# Patient Record
Sex: Male | Born: 1945 | Race: White | Hispanic: No | Marital: Married | Smoking: Current every day smoker
Health system: Southern US, Community
[De-identification: ages and names within clinical notes are randomized; demographics above are authoritative.]

## PROBLEM LIST (undated history)

## (undated) DIAGNOSIS — I10 Essential (primary) hypertension: Secondary | ICD-10-CM

## (undated) DIAGNOSIS — K862 Cyst of pancreas: Secondary | ICD-10-CM

## (undated) DIAGNOSIS — M1990 Unspecified osteoarthritis, unspecified site: Secondary | ICD-10-CM

## (undated) DIAGNOSIS — G8929 Other chronic pain: Secondary | ICD-10-CM

## (undated) HISTORY — PX: TONSILLECTOMY: SUR1361

## (undated) HISTORY — DX: Other chronic pain: G89.29

## (undated) HISTORY — PX: CHOLECYSTECTOMY: SHX55

## (undated) HISTORY — DX: Unspecified osteoarthritis, unspecified site: M19.90

## (undated) HISTORY — DX: Cyst of pancreas: K86.2

---

## 2001-11-14 ENCOUNTER — Encounter: Payer: Self-pay | Admitting: General Surgery

## 2001-11-14 ENCOUNTER — Encounter: Payer: Self-pay | Admitting: Emergency Medicine

## 2001-11-14 ENCOUNTER — Inpatient Hospital Stay (HOSPITAL_COMMUNITY): Admission: EM | Admit: 2001-11-14 | Discharge: 2001-11-16 | Payer: Self-pay | Admitting: Emergency Medicine

## 2015-11-22 ENCOUNTER — Encounter (HOSPITAL_COMMUNITY): Payer: Self-pay | Admitting: Family Medicine

## 2015-11-22 ENCOUNTER — Emergency Department (HOSPITAL_COMMUNITY)
Admission: EM | Admit: 2015-11-22 | Discharge: 2015-11-22 | Disposition: A | Discharge status: Home / Self Care | Payer: Medicare Other | Attending: Emergency Medicine | Admitting: Emergency Medicine

## 2015-11-22 DIAGNOSIS — F17200 Nicotine dependence, unspecified, uncomplicated: Secondary | ICD-10-CM | POA: Diagnosis not present

## 2015-11-22 DIAGNOSIS — Z880 Allergy status to penicillin: Secondary | ICD-10-CM | POA: Insufficient documentation

## 2015-11-22 DIAGNOSIS — R42 Dizziness and giddiness: Secondary | ICD-10-CM | POA: Diagnosis not present

## 2015-11-22 DIAGNOSIS — I10 Essential (primary) hypertension: Secondary | ICD-10-CM | POA: Insufficient documentation

## 2015-11-22 DIAGNOSIS — L02212 Cutaneous abscess of back [any part, except buttock]: Secondary | ICD-10-CM | POA: Diagnosis present

## 2015-11-22 HISTORY — DX: Essential (primary) hypertension: I10

## 2015-11-22 LAB — COMPREHENSIVE METABOLIC PANEL
ALT: 26 U/L (ref 17–63)
AST: 43 U/L — ABNORMAL HIGH (ref 15–41)
Albumin: 3.9 g/dL (ref 3.5–5.0)
Alkaline Phosphatase: 103 U/L (ref 38–126)
Anion gap: 13 (ref 5–15)
BUN: 6 mg/dL (ref 6–20)
CO2: 24 mmol/L (ref 22–32)
Calcium: 9.4 mg/dL (ref 8.9–10.3)
Chloride: 106 mmol/L (ref 101–111)
Creatinine, Ser: 0.92 mg/dL (ref 0.61–1.24)
GFR calc Af Amer: >60 mL/min (ref >60)
GFR calc non Af Amer: >60 mL/min (ref >60)
Glucose, Bld: 106 mg/dL — ABNORMAL HIGH (ref 65–99)
Potassium: 3.7 mmol/L (ref 3.5–5.1)
Sodium: 143 mmol/L (ref 135–145)
Total Bilirubin: 0.3 mg/dL (ref 0.3–1.2)
Total Protein: 7.3 g/dL (ref 6.5–8.1)

## 2015-11-22 LAB — CBC WITH DIFFERENTIAL/PLATELET
Basophils Absolute: 0.1 10*3/uL (ref 0.0–0.1)
Basophils Relative: 1 %
Eosinophils Absolute: 0.1 10*3/uL (ref 0.0–0.7)
Eosinophils Relative: 3 %
HCT: 46.4 % (ref 39.0–52.0)
Hemoglobin: 16.6 g/dL (ref 13.0–17.0)
Lymphocytes Relative: 33 %
Lymphs Abs: 1.5 10*3/uL (ref 0.7–4.0)
MCH: 34.2 pg — ABNORMAL HIGH (ref 26.0–34.0)
MCHC: 35.8 g/dL (ref 30.0–36.0)
MCV: 95.5 fL (ref 78.0–100.0)
Monocytes Absolute: 0.7 10*3/uL (ref 0.1–1.0)
Monocytes Relative: 15 %
Neutro Abs: 2.3 10*3/uL (ref 1.7–7.7)
Neutrophils Relative %: 48 %
Platelets: 183 10*3/uL (ref 150–400)
RBC: 4.86 MIL/uL (ref 4.22–5.81)
RDW: 12.4 % (ref 11.5–15.5)
WBC: 4.7 10*3/uL (ref 4.0–10.5)

## 2015-11-22 LAB — I-STAT CG4 LACTIC ACID, ED: Lactic Acid, Venous: 1.63 mmol/L (ref 0.5–2.0)

## 2015-11-22 MED ORDER — LIDOCAINE-EPINEPHRINE (PF) 2 %-1:200000 IJ SOLN: 10.0000 mL | Freq: Once | INTRAMUSCULAR | Status: DC

## 2015-11-22 MED ORDER — SODIUM BICARBONATE 4 % IV SOLN
5.0000 mL | Freq: Once | INTRAVENOUS | Status: AC
  Administered 2015-11-22: 5 mL via INTRAVENOUS
  Filled 2015-11-22: qty 5

## 2015-11-22 MED ORDER — HYDROCODONE-ACETAMINOPHEN 5-325 MG PO TABS: 1.0000 | ORAL_TABLET | Freq: Four times a day (QID) | ORAL | Status: DC | PRN

## 2015-11-22 MED ORDER — CLINDAMYCIN HCL 150 MG PO CAPS: 300.0000 mg | ORAL_CAPSULE | Freq: Three times a day (TID) | ORAL | Status: DC

## 2015-11-22 MED ORDER — IBUPROFEN 600 MG PO TABS: 600.0000 mg | ORAL_TABLET | Freq: Four times a day (QID) | ORAL | Status: DC | PRN

## 2015-11-22 MED ORDER — OXYCODONE-ACETAMINOPHEN 5-325 MG PO TABS
1.0000 | ORAL_TABLET | Freq: Once | ORAL | Status: AC
  Administered 2015-11-22: 1 via ORAL
  Filled 2015-11-22: qty 1

## 2015-11-22 MED ORDER — LIDOCAINE HCL 2 % IJ SOLN
20.0000 mL | Freq: Once | INTRAMUSCULAR | Status: AC
  Administered 2015-11-22: 400 mg
  Filled 2015-11-22: qty 20

## 2015-11-22 NOTE — ED Provider Notes (Signed)
CSN: [id number]     Arrival date & time 11/22/15  1551 History   First MD Initiated Contact with Patient 11/22/15 1758     Chief Complaint  Patient presents with  . Abscess     (Consider location/radiation/quality/duration/timing/severity/associated sxs/prior Treatment) HPI Dustin Park is a 70 y.o. male with hx of htn, presents to ED with complaint of an abscess to the back. Pt states it began about 3 wks ago. It ha been draining purulent drainage and has been progressively getting larger. Pt states he tried to cut it with a knife but was unable to. He also tried warm compresses without relief. He states it is very painful and he feels like he cannot focus. He denies any fever or chills. There is no redness or streaks around the abscess. He has not taken any medications for this. He reports similar abscess several years ago that had to be I&Ded and states he was told that it may come back.   Past Medical History  Diagnosis Date  . Hypertension    Past Surgical History  Procedure Laterality Date  . Cholecystectomy    . Tonsillectomy     History reviewed. No pertinent family history. Social History  Substance Use Topics  . Smoking status: Current Every Day Smoker -- 1.00 packs/day  . Smokeless tobacco: None  . Alcohol Use: Yes     Comment: 1/2 pint a day    Review of Systems  Constitutional: Negative for fever and chills.  Respiratory: Negative for cough, chest tightness and shortness of breath.   Cardiovascular: Negative for chest pain, palpitations and leg swelling.  Gastrointestinal: Negative for nausea, vomiting, abdominal pain and diarrhea.  Musculoskeletal: Negative for myalgias, arthralgias, neck pain and neck stiffness.  Skin: Positive for wound. Negative for rash.  Allergic/Immunologic: Negative for immunocompromised state.  Neurological: Positive for dizziness and light-headedness. Negative for weakness, numbness and headaches.  All other systems reviewed and are  negative.     Allergies  Penicillins  Home Medications   Prior to Admission medications   Not on File   BP 170/88 mmHg  Pulse 75  Temp(Src) 98.4 F (36.9 C) (Oral)  Resp 18  SpO2 98% Physical Exam  Constitutional: He appears well-developed and well-nourished. No distress.  HENT:  Head: Normocephalic and atraumatic.  Eyes: Conjunctivae are normal.  Neck: Neck supple.  Cardiovascular: Normal rate, regular rhythm and normal heart sounds.   Pulmonary/Chest: Effort normal. No respiratory distress. He has no wheezes. He has no rales.  Abdominal: Soft. Bowel sounds are normal. He exhibits no distension. There is no tenderness. There is no rebound.  Musculoskeletal: He exhibits no edema.  Neurological: He is alert.  Skin: Skin is warm and dry.  5x5 cm abscess to the left lower back. Abscess is erythematous, fluctuant, with small purulent drainage. Ttp. No surrounding cellulitic changes  Nursing note and vitals reviewed.   ED Course  Procedures (including critical care time) Labs Review Labs Reviewed  CBC WITH DIFFERENTIAL/PLATELET - Abnormal; Notable for the following:    MCH 34.2 (*)    All other components within normal limits  COMPREHENSIVE METABOLIC PANEL - Abnormal; Notable for the following:    Glucose, Bld 106 (*)    AST 43 (*)    All other components within normal limits  WOUND CULTURE  I-STAT CG4 LACTIC ACID, ED    Imaging Review No results found. I have personally reviewed and evaluated these images and lab results as part of my medical decision-making.  EKG Interpretation None      INCISION AND DRAINAGE Performed by: Jaynie Crumble A Consent: Verbal consent obtained. Risks and benefits: risks, benefits and alternatives were discussed Type: abscess  Body area: left lower back  Anesthesia: local infiltration  Incision was made with a scalpel.  Local anesthetic: lidocaine 2% wo epinephrine with bicarb  Anesthetic total: 8  ml  Complexity: complex Blunt dissection to break up loculations  Drainage: purulent with sebaceous material  Drainage amount: large  Packing material: 1/4 in iodoform gauze  Patient tolerance: Patient tolerated the procedure well with no immediate complications.    MDM   Final diagnoses:  Abscess of back    patient with lower back skin abscess that appears to be infected. Incised and drained a large amount of purulent and sebaceous material. Packed. We'll continue warm compresses at home. Antibiotics, clindamycin. Ibuprofen and Norco for pain at home. Patient does not appear to be septic or has no evidence of systemic infection at this time. His blood work is unremarkable. His temperature in emergency department is normal. Discussed return precautions and at home wound care. Will have patient follow-up in 2 days.  Filed Vitals:   11/22/15 1815 11/22/15 1830 11/22/15 1845 11/22/15 1900  BP: 160/93 145/86 138/84 141/82  Pulse: 71 63 69 68  Temp:      TempSrc:      Resp:      SpO2: 100% 99% 99% 98%     I personally performed the services described in this documentation, which was scribed in my presence. The recorded information has been reviewed and is accurate.    Jaynie Crumble, PA-C 11/23/15 1029  Linwood Dibbles, MD 11/23/15 [phone]

## 2015-11-22 NOTE — Discharge Instructions (Signed)
Ibuprofen for pain. norco for severe pain. Clindamycin until all gone for infection. Return in 2 days for recheck or if looks better, remove packing yourself. Continue warm compresses to the ara. Change dressing as needed.    Abscess An abscess is an infected area that contains a collection of pus and debris.It can occur in almost any part of the body. An abscess is also known as a furuncle or boil. CAUSES  An abscess occurs when tissue gets infected. This can occur from blockage of oil or sweat glands, infection of hair follicles, or a minor injury to the skin. As the body tries to fight the infection, pus collects in the area and creates pressure under the skin. This pressure causes pain. People with weakened immune systems have difficulty fighting infections and get certain abscesses more often.  SYMPTOMS Usually an abscess develops on the skin and becomes a painful mass that is red, warm, and tender. If the abscess forms under the skin, you may feel a moveable soft area under the skin. Some abscesses break open (rupture) on their own, but most will continue to get worse without care. The infection can spread deeper into the body and eventually into the bloodstream, causing you to feel ill.  DIAGNOSIS  Your caregiver will take your medical history and perform a physical exam. A sample of fluid may also be taken from the abscess to determine what is causing your infection. TREATMENT  Your caregiver may prescribe antibiotic medicines to fight the infection. However, taking antibiotics alone usually does not cure an abscess. Your caregiver may need to make a small cut (incision) in the abscess to drain the pus. In some cases, gauze is packed into the abscess to reduce pain and to continue draining the area. HOME CARE INSTRUCTIONS   Only take over-the-counter or prescription medicines for pain, discomfort, or fever as directed by your caregiver.  If you were prescribed antibiotics, take them as  directed. Finish them even if you start to feel better.  If gauze is used, follow your caregiver's directions for changing the gauze.  To avoid spreading the infection:  Keep your draining abscess covered with a bandage.  Wash your hands well.  Do not share personal care items, towels, or whirlpools with others.  Avoid skin contact with others.  Keep your skin and clothes clean around the abscess.  Keep all follow-up appointments as directed by your caregiver. SEEK MEDICAL CARE IF:   You have increased pain, swelling, redness, fluid drainage, or bleeding.  You have muscle aches, chills, or a general ill feeling.  You have a fever. MAKE SURE YOU:   Understand these instructions.  Will watch your condition.  Will get help right away if you are not doing well or get worse.   This information is not intended to replace advice given to you by your health care provider. Make sure you discuss any questions you have with your health care provider.   Document Released: 07/01/2005 Document Revised: 03/22/2012 Document Reviewed: 12/04/2011 Elsevier Interactive Patient Education [employer]

## 2015-11-22 NOTE — ED Notes (Signed)
Pt A&OX4, ambulatory at d/c with steady gait but wheeled out of ED via wheelchair, NAD 

## 2015-11-22 NOTE — ED Notes (Signed)
Pt here for abscess to mid spine. sts has been there for months and he tried to drain himself, abscess very large.

## 2015-11-25 LAB — WOUND CULTURE
CULTURE: NO GROWTH
GRAM STAIN: NONE SEEN

## 2016-02-03 ENCOUNTER — Other Ambulatory Visit: Payer: Self-pay | Admitting: Dermatology

## 2016-03-12 ENCOUNTER — Other Ambulatory Visit: Payer: Self-pay | Admitting: Dermatology

## 2016-04-21 DIAGNOSIS — F17200 Nicotine dependence, unspecified, uncomplicated: Secondary | ICD-10-CM | POA: Insufficient documentation

## 2016-04-21 DIAGNOSIS — F411 Generalized anxiety disorder: Secondary | ICD-10-CM | POA: Insufficient documentation

## 2018-04-22 DIAGNOSIS — E781 Pure hyperglyceridemia: Secondary | ICD-10-CM | POA: Insufficient documentation

## 2019-10-16 DIAGNOSIS — Z23 Encounter for immunization: Secondary | ICD-10-CM | POA: Insufficient documentation

## 2019-10-16 DIAGNOSIS — L649 Androgenic alopecia, unspecified: Secondary | ICD-10-CM | POA: Insufficient documentation

## 2019-10-16 DIAGNOSIS — M62838 Other muscle spasm: Secondary | ICD-10-CM | POA: Insufficient documentation

## 2019-10-16 DIAGNOSIS — Z0000 Encounter for general adult medical examination without abnormal findings: Secondary | ICD-10-CM | POA: Insufficient documentation
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
## 2020-09-05 ENCOUNTER — Encounter: Payer: Self-pay | Admitting: Family Medicine

## 2020-09-05 ENCOUNTER — Ambulatory Visit (INDEPENDENT_AMBULATORY_CARE_PROVIDER_SITE_OTHER): Payer: Medicare Other | Admitting: Family Medicine

## 2020-09-05 ENCOUNTER — Ambulatory Visit (INDEPENDENT_AMBULATORY_CARE_PROVIDER_SITE_OTHER): Payer: Medicare Other

## 2020-09-05 ENCOUNTER — Other Ambulatory Visit: Payer: Self-pay

## 2020-09-05 VITALS — BP 140/80 | HR 63 | Ht 71.0 in | Wt 153.4 lb

## 2020-09-05 DIAGNOSIS — M545 Low back pain, unspecified: Secondary | ICD-10-CM

## 2020-09-05 DIAGNOSIS — R29898 Other symptoms and signs involving the musculoskeletal system: Secondary | ICD-10-CM

## 2020-09-05 DIAGNOSIS — S32040A Wedge compression fracture of fourth lumbar vertebra, initial encounter for closed fracture: Secondary | ICD-10-CM | POA: Diagnosis not present

## 2020-09-05 DIAGNOSIS — I10 Essential (primary) hypertension: Secondary | ICD-10-CM | POA: Insufficient documentation

## 2020-09-05 DIAGNOSIS — J309 Allergic rhinitis, unspecified: Secondary | ICD-10-CM | POA: Insufficient documentation

## 2020-09-05 MED ORDER — GABAPENTIN 300 MG PO CAPS: 300.0000 mg | ORAL_CAPSULE | Freq: Three times a day (TID) | ORAL | 3 refills | Status: DC | PRN

## 2020-09-05 MED ORDER — PREDNISONE 10 MG PO TABS: 30.0000 mg | ORAL_TABLET | Freq: Every day | ORAL | 0 refills | Status: DC

## 2020-09-05 NOTE — Patient Instructions (Addendum)
Thank you for coming in today.  If you are looking for a stomach doctor Vito Cirigliano, DO.   Please get an Xray today before you leave  You should hear from MRI scheduling within 1 week. If you do not hear please let me know.   Recheck with me following MRI.   We may change the plan if I get suprized by the xray.     Radicular Pain Radicular pain is a type of pain that spreads from your back or neck along a spinal nerve. Spinal nerves are nerves that leave the spinal cord and go to the muscles. Radicular pain is sometimes called radiculopathy, radiculitis, or a pinched nerve. When you have this type of pain, you may also have weakness, numbness, or tingling in the area of your body that is supplied by the nerve. The pain may feel sharp and burning. Depending on which spinal nerve is affected, the pain may occur in the:  Neck area (cervical radicular pain). You may also feel pain, numbness, weakness, or tingling in the arms.  Mid-spine area (thoracic radicular pain). You would feel this pain in the back and chest. This type is rare.  Lower back area (lumbar radicular pain). You would feel this pain as low back pain. You may feel pain, numbness, weakness, or tingling in the buttocks or legs. Sciatica is a type of lumbar radicular pain that shoots down the back of the leg. Radicular pain occurs when one of the spinal nerves becomes irritated or squeezed (compressed). It is often caused by something pushing on a spinal nerve, such as one of the bones of the spine (vertebrae) or one of the round cushions between vertebrae (intervertebral disks). This can result from:  An injury.  Wear and tear or aging of a disk.  The growth of a bone spur that pushes on the nerve. Radicular pain often goes away when you follow instructions from your health care provider for relieving pain at home. Follow these instructions at home: Managing pain      If directed, put ice on the affected area: ? Put  ice in a plastic bag. ? Place a towel between your skin and the bag. ? Leave the ice on for 20 minutes, 2-3 times a day.  If directed, apply heat to the affected area as often as told by your health care provider. Use the heat source that your health care provider recommends, such as a moist heat pack or a heating pad. ? Place a towel between your skin and the heat source. ? Leave the heat on for 20-30 minutes. ? Remove the heat if your skin turns bright red. This is especially important if you are unable to feel pain, heat, or cold. You may have a greater risk of getting burned. Activity   Do not sit or rest in bed for long periods of time.  Try to stay as active as possible. Ask your health care provider what type of exercise or activity is best for you.  Avoid activities that make your pain worse, such as bending and lifting.  Do not lift anything that is heavier than 10 lb (4.5 kg), or the limit that you are told, until your health care provider says that it is safe.  Practice using proper technique when lifting items. Proper lifting technique involves bending your knees and rising up.  Do strength and range-of-motion exercises only as told by your health care provider or physical therapist. General instructions  Take over-the-counter  and prescription medicines only as told by your health care provider.  Pay attention to any changes in your symptoms.  Keep all follow-up visits as told by your health care provider. This is important. ? Your health care provider may send you to a physical therapist to help with this pain. Contact a health care provider if:  Your pain and other symptoms get worse.  Your pain medicine is not helping.  Your pain has not improved after a few weeks of home care.  You have a fever. Get help right away if:  You have severe pain, weakness, or numbness.  You have difficulty with bladder or bowel control. Summary  Radicular pain is a type of  pain that spreads from your back or neck along a spinal nerve.  When you have radicular pain, you may also have weakness, numbness, or tingling in the area of your body that is supplied by the nerve.  The pain may feel sharp or burning.  Radicular pain may be treated with ice, heat, medicines, or physical therapy. This information is not intended to replace advice given to you by your health care provider. Make sure you discuss any questions you have with your health care provider. Document Revised: 04/05/2018 Document Reviewed: 04/05/2018 Elsevier Patient Education  2020 [employer].

## 2020-09-09 NOTE — Progress Notes (Signed)
There is an L4 compression fracture that is hard to tell how old it is.  I already ordered an MRI which should answer questions about compression fracture as well as possible pinched nerves.  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

## 2020-09-10 ENCOUNTER — Other Ambulatory Visit: Payer: Self-pay | Admitting: Family Medicine

## 2020-09-10 DIAGNOSIS — Z0189 Encounter for other specified special examinations: Secondary | ICD-10-CM

## 2020-09-10 DIAGNOSIS — Z1389 Encounter for screening for other disorder: Secondary | ICD-10-CM

## 2020-09-14 ENCOUNTER — Other Ambulatory Visit: Payer: Self-pay

## 2020-09-14 ENCOUNTER — Ambulatory Visit (INDEPENDENT_AMBULATORY_CARE_PROVIDER_SITE_OTHER): Payer: Medicare Other

## 2020-09-14 DIAGNOSIS — M5126 Other intervertebral disc displacement, lumbar region: Secondary | ICD-10-CM

## 2020-09-14 DIAGNOSIS — M48061 Spinal stenosis, lumbar region without neurogenic claudication: Secondary | ICD-10-CM

## 2020-09-14 DIAGNOSIS — M545 Low back pain, unspecified: Secondary | ICD-10-CM

## 2020-09-14 DIAGNOSIS — S32040A Wedge compression fracture of fourth lumbar vertebra, initial encounter for closed fracture: Secondary | ICD-10-CM | POA: Diagnosis not present

## 2020-09-14 DIAGNOSIS — Z1389 Encounter for screening for other disorder: Secondary | ICD-10-CM

## 2020-09-14 DIAGNOSIS — M5136 Other intervertebral disc degeneration, lumbar region: Secondary | ICD-10-CM | POA: Diagnosis not present

## 2020-09-14 DIAGNOSIS — R29898 Other symptoms and signs involving the musculoskeletal system: Secondary | ICD-10-CM

## 2020-09-16 ENCOUNTER — Telehealth: Payer: Self-pay | Admitting: Family Medicine

## 2020-09-16 NOTE — Telephone Encounter (Signed)
[facility] called wanting to make sure that the MRI of the lumbar spine was reviewed.  IMPRESSION: 1. 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 2. Marrow edema in the posterior aspect of the L5 vertebral body without vertebral body height loss or retropulsion.  Can Dr Katrinka Blazing look at this while Dr Denyse Amass is out of the office?

## 2020-09-24 ENCOUNTER — Ambulatory Visit: Payer: Medicare Other | Admitting: Family Medicine

## 2020-09-24 NOTE — Progress Notes (Signed)
I, Dustin Park, LAT, ATC acting as a scribe for Clementeen Graham, MD.  Dustin Park is a 74 y.o. male who presents to [facility] at [facility] today for f/u LBP due to L4 compression fx per MRI and to review his L-spine MRI results. MOI: While working on his motorcycle, it started to fall and he tried to prevent it from falling/tipping and felt a pop in his back and then fell and landed on his buttocks.  Pt was last seen by Dr. Denyse Amass on 09/05/20 and was advised to obtain an MRI and prescribed limited prednisone and gabapentin. Today, pt reports LBP is worse. Pt c/o increased pain after MRI and feels like he twisted when he got off the table. Pt reports numbness/tingling down lateral and anterior thigh. Alleviates: sitting in a soft chair/recliner.  He notes he felt a lot better while he is taking prednisone.  He would like a limited refill if possible.   Dx imaging:09/14/20 L-spine MRI          09/05/20 L-spine XR  Pertinent review of systems: No fevers or chills  Relevant historical information: Smoker   Exam:  BP 126/84 (BP Location: Right Arm, Patient Position: Sitting, Cuff Size: Normal)   Pulse 63   Ht 5\' 11"  (1.803 m)   Wt 153 lb 12.8 oz (69.8 kg)   SpO2 98%   BMI 21.45 kg/m  General: Well Developed, well nourished, and in no acute distress.   MSK: L-spine normal-appearing not particular tender to palpation lumbar spine. Decreased lumbar motion. Decreased strength right leg hip flexion and knee extension 4/5 otherwise intact  Reflexes intact.    Lab and Radiology Results DG Eye Foreign Body  Result Date: 09/14/2020 CLINICAL DATA:  Metal working/exposure; clearance prior to MRI EXAM: ORBITS FOR FOREIGN BODY - 2 VIEW COMPARISON:  None. FINDINGS: There is no evidence of metallic foreign body within the orbits. No significant bone abnormality identified. IMPRESSION: No evidence of metallic foreign body within the orbits. Electronically Signed   By: 14/08/2020 M.D.   On: 09/14/2020 15:17   DG Lumbar Spine 2-3 Views  Result Date: 09/06/2020 CLINICAL DATA:  Low back pain 6 weeks after injury. EXAM: LUMBAR SPINE - 2-3 VIEW COMPARISON:  None. FINDINGS: Vertebral body alignment is normal. There is moderate spondylosis throughout the lumbar spine to include facet arthropathy. There is a moderate L4 compression fracture age indeterminate. Minimal disc space narrowing at the L3-4 L4-5 levels. Remainder the exam is unremarkable. IMPRESSION: 1. Moderate L4 compression fracture age indeterminate. 2. 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 Electronically Signed   By: 14/12/2019 M.D.   On: 09/06/2020 14:40   MR Lumbar Spine Wo Contrast  Result Date: 09/15/2020 CLINICAL DATA:  Low back pain radiating to bilateral legs. EXAM: MRI LUMBAR SPINE WITHOUT CONTRAST TECHNIQUE: Multiplanar, multisequence MR imaging of the lumbar spine was performed. No intravenous contrast was administered. COMPARISON:  Plain films September 05, 2020 FINDINGS: Segmentation:  Standard. Alignment:  Choose Vertebrae: Compression fracture of the L4 vertebral body with loss of approximately 70% vertebral body height and retropulsion of the superior aspect of the posterior wall which in association with facet degeneration results in severe spinal canal stenosis. Associated marrow edema suggest subacute fracture. Marrow edema is also seen in the posterior aspect of the L5 vertebral body without vertebral body height loss or retropulsion. No evidence of discitis or aggressive bone  lesion. Conus medullaris and cauda equina: Conus extends to the T12 level. Conus and cauda equina appear normal. Paraspinal and other soft tissues: Negative. Disc levels: T12-L1: Mild facet degenerative changes without significant spinal canal or neural foraminal stenosis. L1-2: Disc bulge and mild facet degenerative changes without significant spinal canal or neural  foraminal stenosis. L2-3: Disc bulge, facet degenerative changes ligamentum flavum redundancy resulting in mild bilateral neural foraminal narrowing. No significant spinal canal stenosis. L3-4: Retropulsion of the superior aspect of the L4 vertebral body and disc uncovering with associated facet degenerative changes and ligamentum flavum redundancy resulting in severe spinal canal stenosis and severe bilateral neural foraminal narrowing. L4-5: Disc bulge, moderate facet degenerative changes and ligamentum flavum redundancy resulting in moderate bilateral neural foraminal narrowing. L5-S1: Loss of disc height, disc bulge with associated osteophytic component and facet degenerative changes resulting in narrowing of the bilateral subarticular zones and mild bilateral neural foraminal narrowing. IMPRESSION: 1. 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 2. Marrow edema in the posterior aspect of the L5 vertebral body without vertebral body height loss or retropulsion. These results will be called to the ordering clinician or representative by the Radiologist Assistant, and communication documented in the PACS or [employer]. Electronically Signed   By: Baldemar Lenis M.D.   On: 09/15/2020 13:08   I, Clementeen Graham, personally (independently) visualized and performed the interpretation of the images attached in this note.     Assessment and Plan: 74 y.o. male with L4 compression fracture with significant neural impingement causing pain and weakness.  Based on the appearance of compression fracture and the degree of nerve compression I am not sure trials is a great candidate for kyphoplasty or vertebroplasty.  However he may be.  We will refer both to neurosurgery for surgical  opinions and consultation and to interventional radiology for opinion regarding kyphoplasty/vertebroplasty.  Spent time today in clinic discussion with the patient and his wife the MRI findings and potential treatment options. . Limited refill of prednisone to help with the radicular pain.   PDMP not reviewed this encounter. Orders Placed This Encounter  Procedures  . Ambulatory referral to Neurosurgery    Referral Priority:   Routine    Referral Type:   Surgical    Referral Reason:   Specialty Services Required    Requested Specialty:   Neurosurgery    Number of Visits Requested:   1  . Ambulatory referral to Interventional Radiology    Referral Priority:   Routine    Referral Type:   Consultation    Referral Reason:   Specialty Services Required    Requested Specialty:   Interventional Radiology    Number of Visits Requested:   1   Meds ordered this encounter  Medications  . predniSONE (DELTASONE) 10 MG tablet    Sig: Take 3 tablets (30 mg total) by mouth daily with breakfast.    Dispense:  30 tablet    Refill:  0     Discussed warning signs or symptoms. Please see discharge instructions. Patient expresses understanding.   The above documentation has been reviewed and is accurate and complete Clementeen Graham, M.D.

## 2020-09-25 ENCOUNTER — Ambulatory Visit (INDEPENDENT_AMBULATORY_CARE_PROVIDER_SITE_OTHER): Payer: Medicare Other | Admitting: Family Medicine

## 2020-09-25 ENCOUNTER — Other Ambulatory Visit: Payer: Self-pay

## 2020-09-25 ENCOUNTER — Ambulatory Visit: Payer: Medicare Other | Admitting: Family Medicine

## 2020-09-25 VITALS — BP 126/84 | HR 63 | Ht 71.0 in | Wt 153.8 lb

## 2020-09-25 DIAGNOSIS — S32040A Wedge compression fracture of fourth lumbar vertebra, initial encounter for closed fracture: Secondary | ICD-10-CM | POA: Diagnosis not present

## 2020-09-25 DIAGNOSIS — R29898 Other symptoms and signs involving the musculoskeletal system: Secondary | ICD-10-CM

## 2020-09-25 DIAGNOSIS — M48062 Spinal stenosis, lumbar region with neurogenic claudication: Secondary | ICD-10-CM

## 2020-09-25 MED ORDER — PREDNISONE 10 MG PO TABS: 30.0000 mg | ORAL_TABLET | Freq: Every day | ORAL | 0 refills | Status: DC

## 2020-09-25 NOTE — Patient Instructions (Signed)
Thank you for coming in today.  Call IR clinic [phone] to schedule kyphoplasty evaluation.   You should hear from neurosurgery soon about appointment.   Take prednisone for 5-10 days.   Let me know if this is worse.

## 2020-09-26 ENCOUNTER — Other Ambulatory Visit: Payer: Self-pay | Admitting: Family Medicine

## 2020-09-26 DIAGNOSIS — S32000A Wedge compression fracture of unspecified lumbar vertebra, initial encounter for closed fracture: Secondary | ICD-10-CM

## 2020-10-03 ENCOUNTER — Telehealth: Payer: Self-pay | Admitting: Family Medicine

## 2020-10-03 MED ORDER — TRAMADOL HCL 50 MG PO TABS: 50.0000 mg | ORAL_TABLET | Freq: Three times a day (TID) | ORAL | 0 refills | Status: DC | PRN

## 2020-10-03 NOTE — Telephone Encounter (Signed)
[facility] on [address].  This is an opiate for pain

## 2020-10-03 NOTE — Telephone Encounter (Signed)
Patient's wife called stating that the patient was scheduled with a Neurosurgeon for Monday but his wife has tested positive for COVID.  She is going to have to reschedule his appointment and they were asking if a prescription could be sent over to help with his pain. He is currently not taking anything and was hopeful for some relief after the Neurosurgeon. But now that they are having to reschedule, she wanted to know if there was anything he could in the mean time?  Please advise.   [facility] [address]

## 2020-10-09 ENCOUNTER — Other Ambulatory Visit: Payer: Medicare Other

## 2020-10-14 ENCOUNTER — Telehealth: Payer: Self-pay

## 2020-10-14 MED ORDER — TRAMADOL HCL 50 MG PO TABS: 50.0000 mg | ORAL_TABLET | Freq: Three times a day (TID) | ORAL | 0 refills | Status: DC | PRN

## 2020-10-14 NOTE — Addendum Note (Signed)
Addended by: Rodolph Bong on: 10/14/2020 02:46 PM   Modules accepted: Orders

## 2020-10-14 NOTE — Telephone Encounter (Signed)
Tramadol refilled.  When you say appointment with neurologist do you mean neurosurgeon?  Urology: The typical urology group here in [facility] is [facility].  If they cannot get you in a timely manner there are other urology groups to use outside of [place].  There is some in [place] and of course some in [place].  Please let me know where you referred for urology.  It looks like he may have been referred to [facility] urology?  Is this correct?

## 2020-10-14 NOTE — Telephone Encounter (Signed)
Spoke w/ pt and his wife and provided the information Dr. Denyse Amass recommended. Yes, pt mean the neurosurgeon, as he's had to cancel 2 appointments due to COVID. Pt reported he has an appointment scheduled for 10/28/20 and will contact [facility] to see if he can be seen sooner. Pt expressed appreciation of the Tramadol refill. Pt verbalized understanding.

## 2020-10-14 NOTE — Telephone Encounter (Signed)
Patients Wife called and stated Patient has not been able to go to neurologist because he has developed a bladder infection and has tested positive for COVID.   Tramadol was given for a short period of time for the compression fracture till he could get in with neurologist. Well that appointment has been cancelled because of COVID and bladder infections. Patient has been cutting the tramadol in half to make them last but are afraid of running out and the pain is so bad. Was told from the people who saw him for bladder infection wouldn't give any because they haven't treated him for anything but the infection and was advised to ask Dr. Zollie Pee office.    Urologist appointment is not until March and they cannot see him until then, patient's wife wondering if there was any other place that can see him sooner. Patient stated "I'll either be better or dead."

## 2020-10-28 ENCOUNTER — Other Ambulatory Visit: Payer: Self-pay | Admitting: Neurosurgery

## 2020-10-28 DIAGNOSIS — S32040A Wedge compression fracture of fourth lumbar vertebra, initial encounter for closed fracture: Secondary | ICD-10-CM

## 2020-10-30 ENCOUNTER — Ambulatory Visit
Admission: RE | Admit: 2020-10-30 | Discharge: 2020-10-30 | Disposition: A | Discharge status: Home / Self Care | Payer: Medicare Other | Source: Ambulatory Visit | Attending: Neurosurgery | Admitting: Neurosurgery

## 2020-10-30 DIAGNOSIS — S32040A Wedge compression fracture of fourth lumbar vertebra, initial encounter for closed fracture: Secondary | ICD-10-CM

## 2021-02-14 ENCOUNTER — Other Ambulatory Visit: Payer: Self-pay | Admitting: Neurosurgery

## 2021-03-14 ENCOUNTER — Other Ambulatory Visit: Payer: Self-pay | Admitting: Family Medicine

## 2021-03-17 NOTE — Telephone Encounter (Signed)
Rx refill request approved per Dr. Corey's orders. 

## 2021-03-20 ENCOUNTER — Inpatient Hospital Stay: Admit: 2021-03-20 | Payer: Medicare Other

## 2021-03-20 SURGERY — POSTERIOR LUMBAR FUSION 2 LEVEL
Anesthesia: General

## 2021-10-20 ENCOUNTER — Other Ambulatory Visit: Payer: Self-pay | Admitting: Family Medicine

## 2022-04-17 IMAGING — MR MR LUMBAR SPINE W/O CM
4 of 5 series · 25 of 48 positions shown · non-contrast
Comparison: Plain films September 05, 2020

CLINICAL DATA: Low back pain radiating to bilateral legs.

EXAM:
MRI LUMBAR SPINE WITHOUT CONTRAST
TECHNIQUE: Multiplanar, multisequence MR imaging of the lumbar spine was
performed. No intravenous contrast was administered.

[Series 4: T2 · sagittal · 4.0mm · 0.81mm/px · 6 of 19 slices shown (1 of 2)]
[im 1/19]
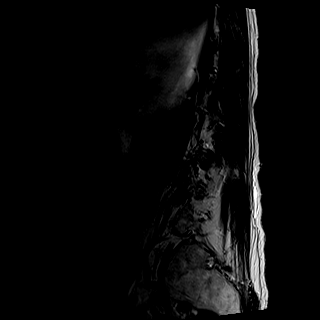
[im 4/19]
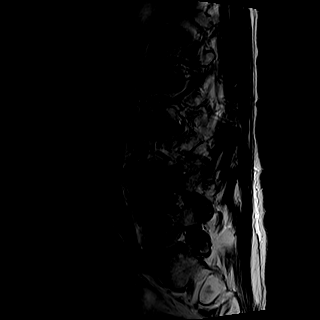
[im 8/19]
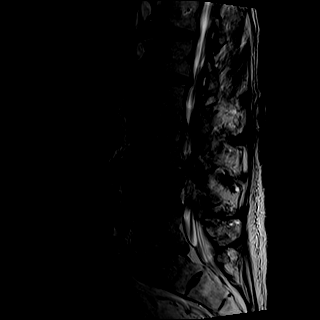
[im 11/19]
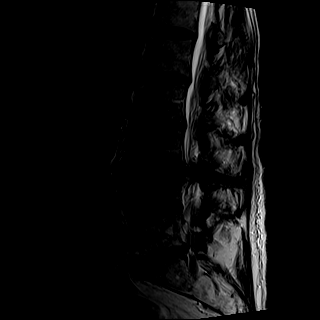
[im 15/19]
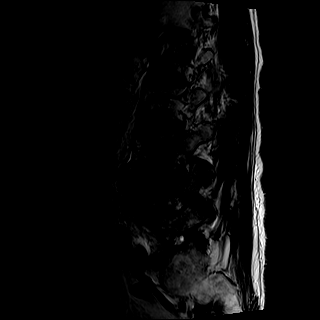
[im 19/19]
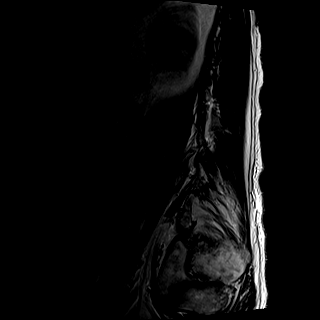

[Series 5: T1 · sagittal · 4.0mm · 0.41mm/px · 6 of 19 slices shown (1 of 2)]
[im 1/19]
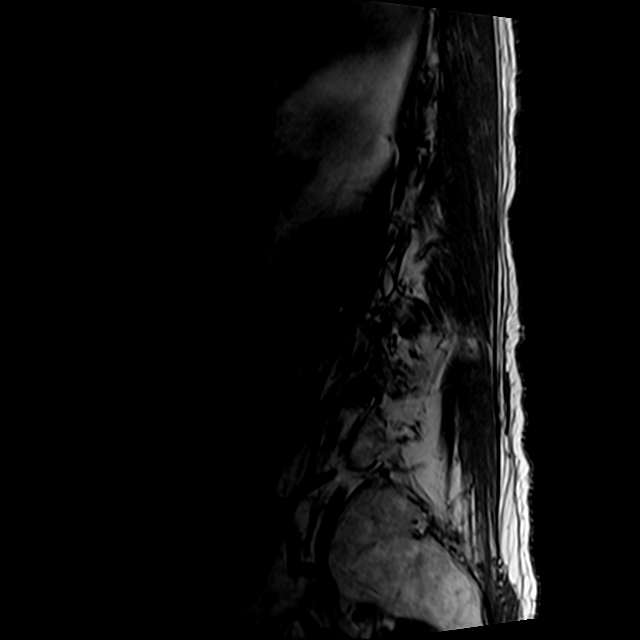
[im 4/19]
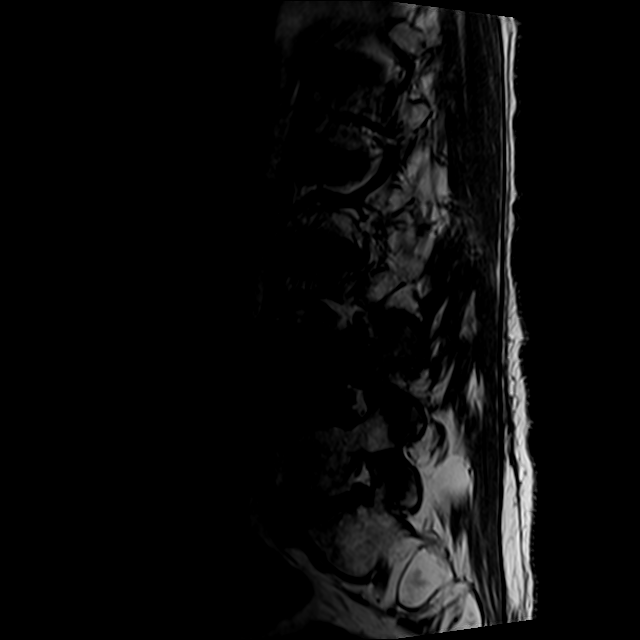
[im 8/19]
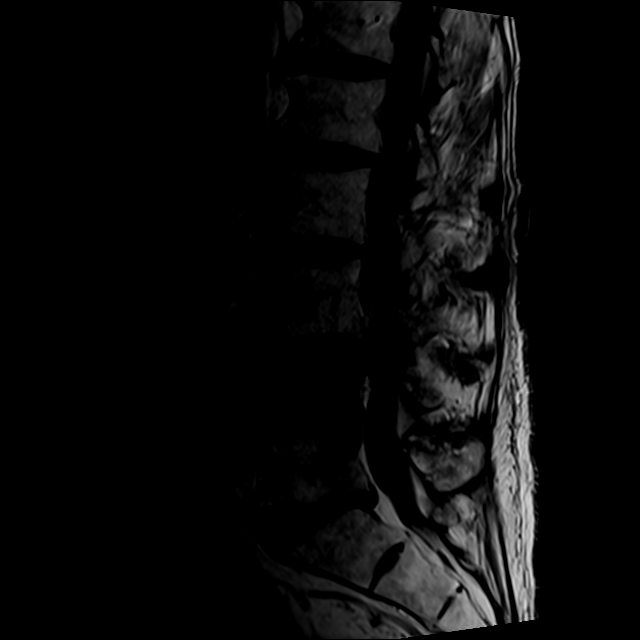
[im 11/19]
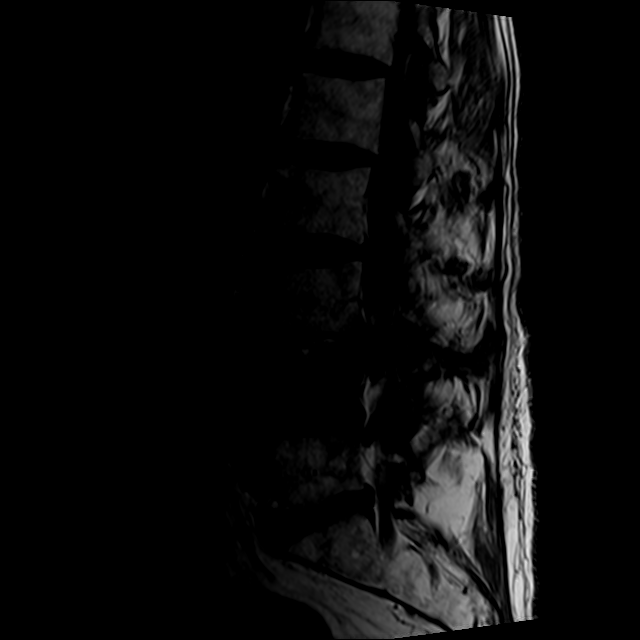
[im 15/19]
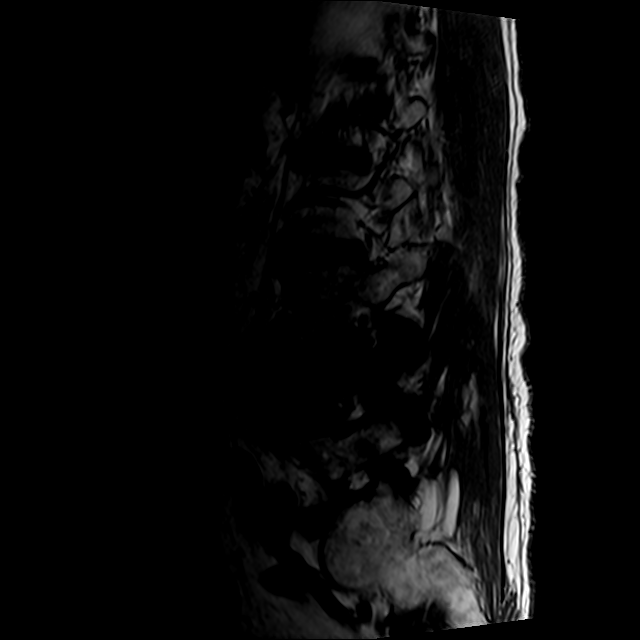
[im 19/19]
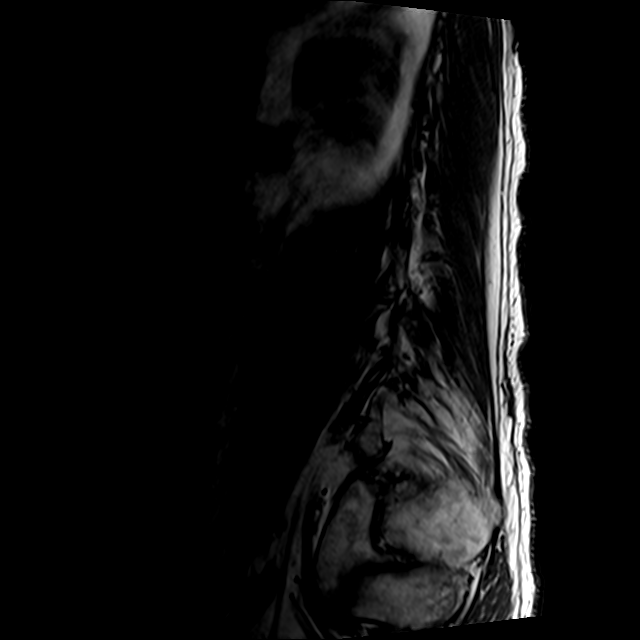

[Series 9: T2 · axial · 4.0mm · 0.78mm/px · z∈[-149,+75]mm · 9 of 45 slices shown (2 of 2)]
[im 1/45]
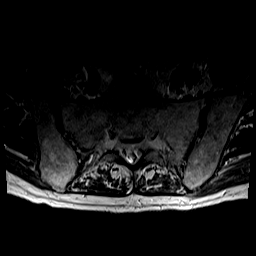
[im 7/45]
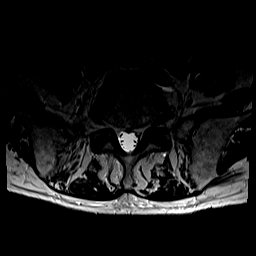
[im 13/45]
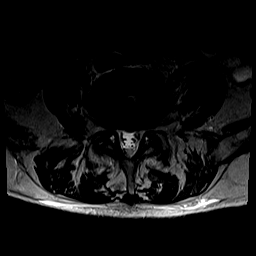
[im 19/45]
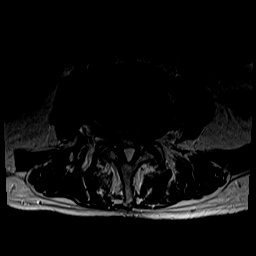
[im 23/45]
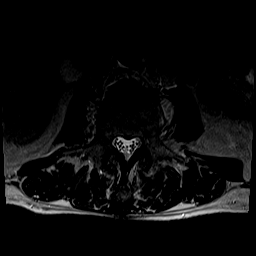
[im 26/45]
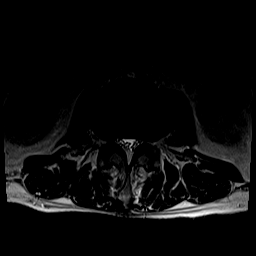
[im 32/45]
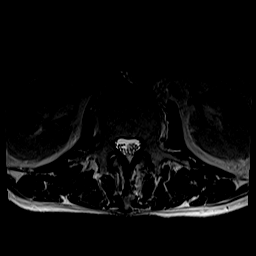
[im 38/45]
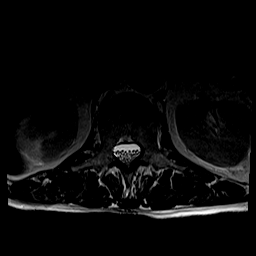
[im 45/45]
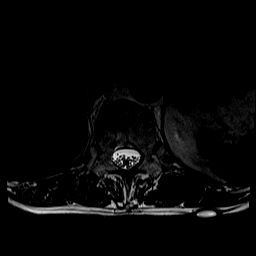

[Series 12: T1 · axial · 4.0mm · 0.39mm/px · z∈[-149,+41]mm · 4 of 45 slices shown (2 of 2)]
[im 1/45]
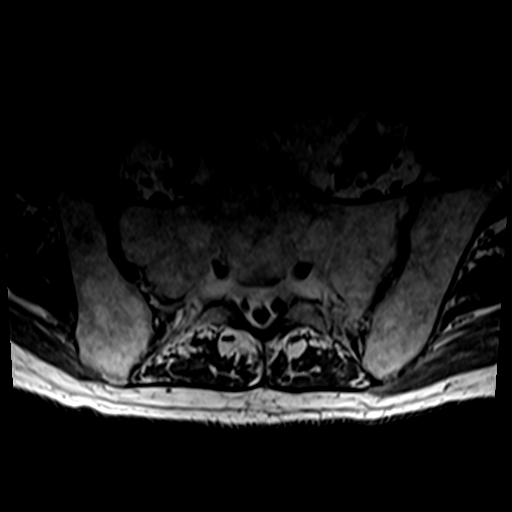
[im 7/45]
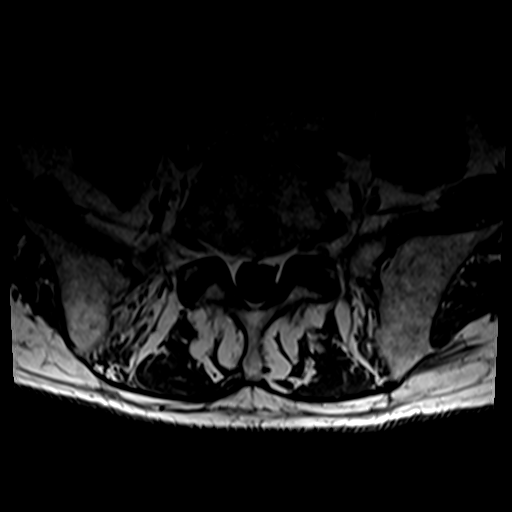
[im 23/45]
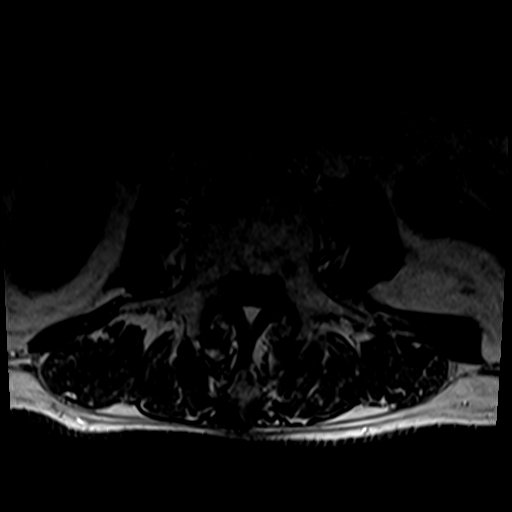
[im 38/45]
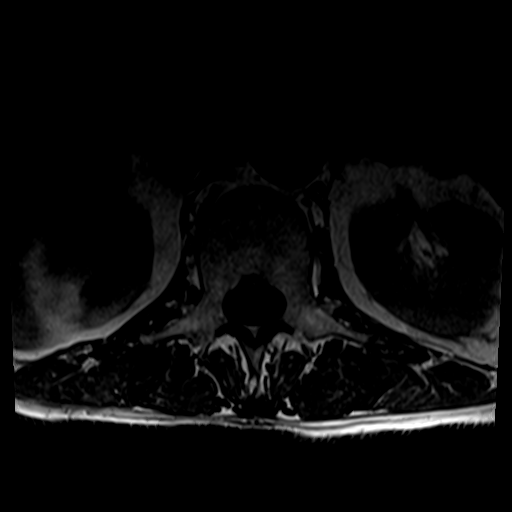

[25 of 48 positions shown; findings below may reference images not displayed]

FINDINGS: Segmentation:  Standard.

Alignment:  Choose

Vertebrae: Compression fracture of the L4 vertebral body with loss
of approximately 70% vertebral body height and retropulsion of the
superior aspect of the posterior wall which in association with
facet degeneration results in severe spinal canal stenosis.
Associated marrow edema suggest subacute fracture. Marrow edema is
also seen in the posterior aspect of the L5 vertebral body without
vertebral body height loss or retropulsion.

No evidence of discitis or aggressive bone lesion.

Conus medullaris and cauda equina: Conus extends to the T12 level.
Conus and cauda equina appear normal.

Paraspinal and other soft tissues: Negative.

Disc levels:

T12-L1: Mild facet degenerative changes without significant spinal
canal or neural foraminal stenosis.

L1-2: Disc bulge and mild facet degenerative changes without
significant spinal canal or neural foraminal stenosis.

L2-3: Disc bulge, facet degenerative changes ligamentum flavum
redundancy resulting in mild bilateral neural foraminal narrowing.
No significant spinal canal stenosis.

L3-4: Retropulsion of the superior aspect of the L4 vertebral body
and disc uncovering with associated facet degenerative changes and
ligamentum flavum redundancy resulting in severe spinal canal
stenosis and severe bilateral neural foraminal narrowing.

L4-5: Disc bulge, moderate facet degenerative changes and ligamentum
flavum redundancy resulting in moderate bilateral neural foraminal
narrowing.

L5-S1: Loss of disc height, disc bulge with associated osteophytic
component and facet degenerative changes resulting in narrowing of
the bilateral subarticular zones and mild bilateral neural foraminal
narrowing.
IMPRESSION: 1. Compression fracture of the L4 vertebral body with loss of
approximately 70% vertebral body height and retropulsion of the
superior aspect of the posterior wall contributing to severe spinal
canal stenosis and severe bilateral neural foraminal narrowing at
L3-L4.
2. Marrow edema in the posterior aspect of the L5 vertebral body
without vertebral body height loss or retropulsion.

These results will be called to the ordering clinician or
representative by the Radiologist Assistant, and communication
documented in the PACS or [REDACTED].

## 2022-06-30 ENCOUNTER — Encounter: Payer: Self-pay | Admitting: Internal Medicine

## 2022-08-05 ENCOUNTER — Ambulatory Visit: Payer: Medicare Other | Admitting: Internal Medicine

## 2023-03-03 ENCOUNTER — Other Ambulatory Visit: Payer: Self-pay | Admitting: Family Medicine

## 2023-03-03 NOTE — Telephone Encounter (Signed)
Rx refill request approved per Dr. Corey's orders. 

## 2023-10-25 ENCOUNTER — Other Ambulatory Visit: Payer: Self-pay | Admitting: Family Medicine

## 2023-10-25 NOTE — Telephone Encounter (Signed)
Pt has not been seen by Dr. Denyse Amass since 2021. Rx refill not appropriate.

## 2023-11-30 ENCOUNTER — Ambulatory Visit: Payer: Medicare HMO | Admitting: Family Medicine

## 2023-11-30 ENCOUNTER — Ambulatory Visit (INDEPENDENT_AMBULATORY_CARE_PROVIDER_SITE_OTHER): Payer: Medicare HMO

## 2023-11-30 VITALS — BP 104/62 | HR 75 | Ht 71.0 in

## 2023-11-30 DIAGNOSIS — M545 Low back pain, unspecified: Secondary | ICD-10-CM | POA: Diagnosis not present

## 2023-11-30 DIAGNOSIS — M4856XA Collapsed vertebra, not elsewhere classified, lumbar region, initial encounter for fracture: Secondary | ICD-10-CM

## 2023-11-30 DIAGNOSIS — G8929 Other chronic pain: Secondary | ICD-10-CM | POA: Diagnosis not present

## 2023-11-30 MED ORDER — GABAPENTIN 300 MG PO CAPS: 300.0000 mg | ORAL_CAPSULE | Freq: Three times a day (TID) | ORAL | 3 refills | Status: AC | PRN

## 2023-11-30 NOTE — Patient Instructions (Addendum)
 Thank you for coming in today.   You should hear from MRI scheduling within 1 week. If you do not hear please let me know.    I've sent a prescription for Gabapentin to your pharmacy.   Check back once we get the MRI results.

## 2023-11-30 NOTE — Progress Notes (Signed)
 Rubin Payor, PhD, LAT, ATC acting as a scribe for Clementeen Graham, MD.  Dustin Park is a 78 y.o. male who presents to [facility] at [facility] today for back pain. Pt was last seen by Dr. Denyse Amass in 2021 for LBP w/ a compression fx at L4.   Today, pt reports he needs a refill on his gabapentin. LBP has worsened over the last 6 months. He has suffered several falls since his last visit. Pt locates pain to the midline and both sides of his low back. He notes pain is radiating around anteriorly. Pt's wife notes that he also has a decrease appetite and weight loss.   Additionally he notes right shoulder pain and bruising.  He felt a pop in his proximal right shoulder while lifting an object about a week and a half ago.  A few days later he developed significant bruising of the proximal upper arm and down into the elbow and lower arm.  Overall he feels okay with a little bit of soreness.  He is not on blood thinners.  Dx imaging: 10/30/20 L-spine CT  09/14/20 L-spine MRI  09/05/20 L-spine XR  Pertinent review of systems: No fevers or chills  Relevant historical information: History of vertebral compression fracture.  Hypertension.  Tobacco smoker.   Exam:  BP 104/62   Pulse 75   Ht 5\' 11"  (1.803 m)   SpO2 97%   BMI 21.45 kg/m  General: Thin elderly man no acute distress.  MSK: L-spine nontender to palpation midline decreased lumbar motion.  Lower extremity strength is intact.  Reflexes are intact. Decreased muscle bulk bilateral lower extremities.   Right shoulder: Bruising present proximal upper arm and into the elbow area.  Popeye arm deformity is present.  Intact strength to flexion and supination.  Some pain is reproduced with resisted supination. Pulses cap refill and sensation intact distally.   Lab and Radiology Results  X-ray images lumbar spine obtained today personally independently interpreted. Chronic appearing old L4 compression fracture is  visible without significant change.  L5 vertebrae does look like it is a little more compressed than it has previously this could be an acute compression fracture. Wait formal radiology review     Assessment and Plan: 78 y.o. male with worsening acute exacerbation of chronic back pain.  He had a pretty severe compression fracture/burst fracture at L4 several years ago.  Neurosurgery did recommend surgery but he declined.  He notes continued now worsening pain in his back and lower extremities.  New x-ray today is concerning for a new L5 compression fracture.  Plan for MRI lumbar spine to evaluate source of his current back pain and his lumbar radiculopathy.  He is not willing to consider surgery.  Consider epidural steroid injection or physical therapy based on MRI results.  Return after MRI. Gabapentin refilled.   PDMP not reviewed this encounter. Orders Placed This Encounter  Procedures   DG Lumbar Spine 2-3 Views    Standing Status:   Future    Number of Occurrences:   1    Expiration Date:   12/28/2023    Reason for Exam (SYMPTOM  OR DIAGNOSIS REQUIRED):   low back pain    Preferred imaging location?:   [facility]   MR Lumbar Spine Wo Contrast    Standing Status:   Future    Expiration Date:   11/29/2024    What is the patient's sedation requirement?:   No Sedation  Does the patient have a pacemaker or implanted devices?:   No    Preferred imaging location?:   [facility] (table limit-550lbs)   Meds ordered this encounter  Medications   gabapentin (NEURONTIN) 300 MG capsule    Sig: Take 1 capsule (300 mg total) by mouth 3 (three) times daily as needed.    Dispense:  270 capsule    Refill:  3     Discussed warning signs or symptoms. Please see discharge instructions. Patient expresses understanding.   The above documentation has been reviewed and is accurate and complete Clementeen Graham, M.D.

## 2023-12-20 NOTE — Progress Notes (Signed)
 Lumbar spine x-ray shows possible compression fractures at T12 and L1-L2 and L5 and a chronic fracture at L4. The MRI that I ordered which is scheduled for Thursday will show [place] more definitively how old these fractures are and if you need special treatment.

## 2023-12-23 ENCOUNTER — Ambulatory Visit
Admission: RE | Admit: 2023-12-23 | Discharge: 2023-12-23 | Disposition: A | Discharge status: Home / Self Care | Payer: Medicare HMO | Source: Ambulatory Visit | Attending: Family Medicine | Admitting: Family Medicine

## 2023-12-23 DIAGNOSIS — G8929 Other chronic pain: Secondary | ICD-10-CM

## 2023-12-28 ENCOUNTER — Telehealth: Payer: Self-pay | Admitting: Family Medicine

## 2023-12-28 NOTE — Telephone Encounter (Signed)
 I definitely can see the old compression fractures on this MRI.  I think there is a new 1 but I cannot quite tell.  We are asking radiology to read it today.  You should expect a report in your in basket shortly.

## 2023-12-28 NOTE — Telephone Encounter (Signed)
 Patients wife called to make appointment to discuss MRI results and they have not been read yet. She asked to give her a call when they have been read so that she can schedule an appointment.

## 2023-12-29 NOTE — Progress Notes (Signed)
 Lumbar spine MRI shows acute compression fractures at multiple locations.  Recommend return to clinic to talk about results in full detail and talk about treatment options.

## 2023-12-30 NOTE — Telephone Encounter (Signed)
 Spoke to pt and his wife this morning. See result note. F/u visit scheduled

## 2024-01-05 ENCOUNTER — Encounter: Payer: Self-pay | Admitting: Family Medicine

## 2024-01-05 ENCOUNTER — Ambulatory Visit: Admitting: Family Medicine

## 2024-01-05 VITALS — Ht 71.0 in

## 2024-01-05 DIAGNOSIS — M4856XA Collapsed vertebra, not elsewhere classified, lumbar region, initial encounter for fracture: Secondary | ICD-10-CM | POA: Diagnosis not present

## 2024-01-05 DIAGNOSIS — G8929 Other chronic pain: Secondary | ICD-10-CM

## 2024-01-05 DIAGNOSIS — M545 Low back pain, unspecified: Secondary | ICD-10-CM

## 2024-01-05 DIAGNOSIS — M48061 Spinal stenosis, lumbar region without neurogenic claudication: Secondary | ICD-10-CM

## 2024-01-05 NOTE — Patient Instructions (Addendum)
 Thank you for coming in today.   At the check out desk, please schedule your DEXA scan. This is so we can evaluate your bone density  An order has been placed for a back injection. [facility] will reach out to you to assist with scheduling.   A referral has been placed to Pain Management for medication management.   Recommend a rolling walker  Let me know if you would like to get set up for Aquatic Therapy.   Follow up to review Bone Density (DEXA) results.

## 2024-01-05 NOTE — Progress Notes (Addendum)
 I, Dustin Park, CMA acting as a scribe for Dustin Juniper, MD.  MD [name] is a 78 y.o. male who presents to [facility] today for LBP w/ compression fx and L-spine MRI review. Pt was last seen by Dr. Alease Park on 11/30/23 and based on XR findings, MRI was ordered.  Today, pt reports continued low back pain and some leg pain.  Pain is worse with back extension and better with back flexion.  He is not interested or excited about physical therapy and does not think it will help.  Dx imaging: 12/23/23 L-spine MRI  11/30/23 L-spine XR 10/30/20 L-spine CT             09/14/20 L-spine MRI             09/05/20 L-spine XR  Pertinent review of systems: No fevers or chills Relevant historical information: Hypertension and GAD.   Exam:  Ht 5\' 11"  (1.803 m)   BMI 21.45 kg/m  General: Well Developed, well nourished, and in no acute distress.   MSK: L-spine decreased lumbar motion lacking full extension.  Lower extremity strength is intact.    Lab and Radiology Results  EXAM: MRI LUMBAR SPINE WITHOUT CONTRAST   TECHNIQUE: Multiplanar, multisequence MR imaging of the lumbar spine was performed. No intravenous contrast was administered.   COMPARISON:  Radiography 11/30/2023.  MRI 09/14/2020.   FINDINGS: Segmentation: 5 lumbar type vertebral bodies as numbered previously.   Alignment:  Straightening of the normal lumbar lordosis.   Vertebrae: Acute/subacute compression deformities as follows. Inferior endplate T11 with loss of height of less than 10%. T12 vertebral body, inferior endplate more than superior endplate, with loss of height of 25%. L1 vertebral body superior endplate with loss of height of 20%. L2 vertebral body with loss of height of about 20%. L3 remains intact. Old compression deformity at L4 which is healed. Late subacute superior endplate fracture at L5 with loss of height of only 10%. No sacral fracture.   Conus medullaris and  cauda equina: Conus extends to the L1 level. Conus and cauda equina appear normal.   Paraspinal and other soft tissues: Negative   Disc levels:   Disc bulges at T11-12, T12-L1 and L1-2 but no compressive stenosis. At L2-3, there are endplate osteophytes and bulging of the disc with facet and ligamentous hypertrophy and moderate multifactorial stenosis. No stenosis present at L3-4, L4-5 or L5-S1.   IMPRESSION: 1. Acute/subacute compression deformities at T11, T12, L1 and L2. Relatively minor loss of height at these levels, but with bone marrow edema indicating that they are relatively recent and likely painful. Late subacute superior endplate fracture at L5 with loss of height of only 10%. 2. Old compression deformity at L4 which is healed. 3. L2-3: Moderate multifactorial stenosis due to endplate osteophytes, bulging of the disc and facet and ligamentous hypertrophy.     Electronically Signed   By: Dustin Park M.D.   On: 12/28/2023 19:49 I, Dustin Park, personally (independently) visualized and performed the interpretation of the images attached in this note.     Assessment and Plan: 78 y.o. male with chronic low back pain occurring in the setting of subacute vertebral compression fractures.  He is not a good candidate for kyphoplasty or vertebroplasty as he has so many different levels that are affected.  Back bracing is not ideal as well given his general back positioning and level of discomfort.  Will refer to pain clinic for pain management. Additionally  we will proceed with an epidural steroid injection for the spinal stenosis seen at L2-3.  Lastly proceed with DEXA scan.    Addendum: I received a letter from the insurance company requesting more information to proceed with an epidural steroid injection.  Dustin Park has severe pain as a result of his spinal stenosis.  Pain score is 8 out of 10 or higher at times.  Unfortunately he is not capable of trying typical conservative  management.  He has vertebral compression fractures and has trouble with ambulation.  He does not have the functional capacity to do conventional physical therapy, nor would it be appropriate given his current medical condition.  Since this office note in early April he has been admitted to the hospital with hyponatremia and a fall.  I think it is medically necessary for him to proceed with his injection as soon as possible as I believe his fall is related to potential spinal stenosis and pain.  PDMP not reviewed this encounter. Orders Placed This Encounter  Procedures   DG Bone Density    Standing Status:   Future    Expiration Date:   01/04/2025    Reason for Exam (SYMPTOM  OR DIAGNOSIS REQUIRED):   evaluate bone density    Preferred imaging location?:   [address]   DG INJECT DIAG/THERA/INC NEEDLE/CATH/PLC EPI/LUMB/SAC W/IMG    Level and technique per radiology    Standing Status:   Future    Expiration Date:   02/04/2024    Reason for Exam (SYMPTOM  OR DIAGNOSIS REQUIRED):   Low back pain, L2-L3    Preferred Imaging Location?:   [facility]   Ambulatory referral to Pain Clinic    Referral Priority:   Routine    Referral Type:   Consultation    Referral Reason:   Specialty Services Required    Requested Specialty:   Pain Medicine    Number of Visits Requested:   1   No orders of the defined types were placed in this encounter.    Discussed warning signs or symptoms. Please see discharge instructions. Patient expresses understanding.   The above documentation has been reviewed and is accurate and complete Dustin Park, M.D.

## 2024-01-11 ENCOUNTER — Ambulatory Visit (INDEPENDENT_AMBULATORY_CARE_PROVIDER_SITE_OTHER)
Admission: RE | Admit: 2024-01-11 | Discharge: 2024-01-11 | Disposition: A | Discharge status: Home / Self Care | Source: Ambulatory Visit | Attending: Family Medicine | Admitting: Family Medicine

## 2024-01-11 DIAGNOSIS — M4856XA Collapsed vertebra, not elsewhere classified, lumbar region, initial encounter for fracture: Secondary | ICD-10-CM

## 2024-01-12 ENCOUNTER — Encounter: Payer: Self-pay | Admitting: Family Medicine

## 2024-01-14 NOTE — Discharge Instructions (Signed)

## 2024-01-14 NOTE — Progress Notes (Signed)
 Bone density test shows really bad bone mineral density.  We should get you on medicines for osteoporosis.  Please schedule an appointment with me week after next.

## 2024-01-17 ENCOUNTER — Inpatient Hospital Stay
Admission: RE | Admit: 2024-01-17 | Discharge: 2024-01-17 | Disposition: A | Discharge status: Home / Self Care | Source: Ambulatory Visit | Attending: Family Medicine | Admitting: Family Medicine

## 2024-01-18 NOTE — Discharge Instructions (Signed)

## 2024-01-20 ENCOUNTER — Inpatient Hospital Stay
Admission: RE | Admit: 2024-01-20 | Discharge: 2024-01-20 | Disposition: A | Discharge status: Home / Self Care | Source: Ambulatory Visit | Attending: Family Medicine | Admitting: Family Medicine

## 2024-01-21 NOTE — Discharge Instructions (Signed)

## 2024-01-24 ENCOUNTER — Emergency Department (HOSPITAL_COMMUNITY)

## 2024-01-24 ENCOUNTER — Inpatient Hospital Stay (HOSPITAL_COMMUNITY)
Admission: EM | Admit: 2024-01-24 | Discharge: 2024-01-28 | DRG: 640 | Disposition: A | Discharge status: Home Health | Attending: Student | Admitting: Student

## 2024-01-24 ENCOUNTER — Inpatient Hospital Stay
Admission: RE | Admit: 2024-01-24 | Discharge: 2024-01-24 | Disposition: A | Discharge status: Home / Self Care | Source: Ambulatory Visit | Attending: Family Medicine | Admitting: Family Medicine

## 2024-01-24 ENCOUNTER — Inpatient Hospital Stay (HOSPITAL_COMMUNITY)

## 2024-01-24 DIAGNOSIS — E871 Hypo-osmolality and hyponatremia: Secondary | ICD-10-CM | POA: Diagnosis present

## 2024-01-24 DIAGNOSIS — Z880 Allergy status to penicillin: Secondary | ICD-10-CM

## 2024-01-24 DIAGNOSIS — R569 Unspecified convulsions: Secondary | ICD-10-CM | POA: Diagnosis present

## 2024-01-24 DIAGNOSIS — L89152 Pressure ulcer of sacral region, stage 2: Secondary | ICD-10-CM | POA: Diagnosis present

## 2024-01-24 DIAGNOSIS — R54 Age-related physical debility: Secondary | ICD-10-CM | POA: Diagnosis present

## 2024-01-24 DIAGNOSIS — Z79899 Other long term (current) drug therapy: Secondary | ICD-10-CM

## 2024-01-24 DIAGNOSIS — G8929 Other chronic pain: Secondary | ICD-10-CM | POA: Diagnosis present

## 2024-01-24 DIAGNOSIS — J690 Pneumonitis due to inhalation of food and vomit: Secondary | ICD-10-CM | POA: Diagnosis present

## 2024-01-24 DIAGNOSIS — R627 Adult failure to thrive: Secondary | ICD-10-CM | POA: Diagnosis present

## 2024-01-24 DIAGNOSIS — J9601 Acute respiratory failure with hypoxia: Secondary | ICD-10-CM | POA: Diagnosis present

## 2024-01-24 DIAGNOSIS — Z681 Body mass index (BMI) 19 or less, adult: Secondary | ICD-10-CM | POA: Diagnosis not present

## 2024-01-24 DIAGNOSIS — J189 Pneumonia, unspecified organism: Secondary | ICD-10-CM

## 2024-01-24 DIAGNOSIS — I10 Essential (primary) hypertension: Secondary | ICD-10-CM | POA: Diagnosis present

## 2024-01-24 DIAGNOSIS — M4856XA Collapsed vertebra, not elsewhere classified, lumbar region, initial encounter for fracture: Secondary | ICD-10-CM | POA: Diagnosis present

## 2024-01-24 DIAGNOSIS — Z7401 Bed confinement status: Secondary | ICD-10-CM | POA: Diagnosis not present

## 2024-01-24 DIAGNOSIS — Z91040 Latex allergy status: Secondary | ICD-10-CM

## 2024-01-24 DIAGNOSIS — W19XXXA Unspecified fall, initial encounter: Secondary | ICD-10-CM | POA: Diagnosis present

## 2024-01-24 DIAGNOSIS — E861 Hypovolemia: Secondary | ICD-10-CM | POA: Diagnosis present

## 2024-01-24 DIAGNOSIS — L8990 Pressure ulcer of unspecified site, unspecified stage: Secondary | ICD-10-CM | POA: Diagnosis present

## 2024-01-24 DIAGNOSIS — R1310 Dysphagia, unspecified: Secondary | ICD-10-CM | POA: Diagnosis present

## 2024-01-24 DIAGNOSIS — E875 Hyperkalemia: Secondary | ICD-10-CM | POA: Diagnosis present

## 2024-01-24 DIAGNOSIS — F10930 Alcohol use, unspecified with withdrawal, uncomplicated: Secondary | ICD-10-CM | POA: Diagnosis not present

## 2024-01-24 DIAGNOSIS — F17200 Nicotine dependence, unspecified, uncomplicated: Secondary | ICD-10-CM | POA: Diagnosis present

## 2024-01-24 DIAGNOSIS — M4854XA Collapsed vertebra, not elsewhere classified, thoracic region, initial encounter for fracture: Secondary | ICD-10-CM | POA: Diagnosis present

## 2024-01-24 DIAGNOSIS — F10139 Alcohol abuse with withdrawal, unspecified: Secondary | ICD-10-CM | POA: Diagnosis present

## 2024-01-24 LAB — CBC WITH DIFFERENTIAL/PLATELET
Abs Immature Granulocytes: 0.13 10*3/uL — ABNORMAL HIGH (ref 0.00–0.07)
Basophils Absolute: 0 10*3/uL (ref 0.0–0.1)
Basophils Relative: 0 %
Eosinophils Absolute: 0 10*3/uL (ref 0.0–0.5)
Eosinophils Relative: 0 %
HCT: 39.3 % (ref 39.0–52.0)
Hemoglobin: 14.6 g/dL (ref 13.0–17.0)
Immature Granulocytes: 1 %
Lymphocytes Relative: 3 %
Lymphs Abs: 0.5 10*3/uL — ABNORMAL LOW (ref 0.7–4.0)
MCH: 35 pg — ABNORMAL HIGH (ref 26.0–34.0)
MCHC: 37.2 g/dL — ABNORMAL HIGH (ref 30.0–36.0)
MCV: 94.2 fL (ref 80.0–100.0)
Monocytes Absolute: 1 10*3/uL (ref 0.1–1.0)
Monocytes Relative: 6 %
Neutro Abs: 16.2 10*3/uL — ABNORMAL HIGH (ref 1.7–7.7)
Neutrophils Relative %: 90 %
Platelets: 192 10*3/uL (ref 150–400)
RBC: 4.17 MIL/uL — ABNORMAL LOW (ref 4.22–5.81)
RDW: 11.2 % — ABNORMAL LOW (ref 11.5–15.5)
WBC: 17.8 10*3/uL — ABNORMAL HIGH (ref 4.0–10.5)
nRBC: 0 % (ref 0.0–0.2)

## 2024-01-24 LAB — COMPREHENSIVE METABOLIC PANEL WITH GFR
ALT: 16 U/L (ref 0–44)
AST: 36 U/L (ref 15–41)
Albumin: 3.2 g/dL — ABNORMAL LOW (ref 3.5–5.0)
Alkaline Phosphatase: 114 U/L (ref 38–126)
Anion gap: 13 (ref 5–15)
BUN: 8 mg/dL (ref 8–23)
CO2: 29 mmol/L (ref 22–32)
Calcium: 8.6 mg/dL — ABNORMAL LOW (ref 8.9–10.3)
Chloride: 73 mmol/L — ABNORMAL LOW (ref 98–111)
Creatinine, Ser: 0.49 mg/dL — ABNORMAL LOW (ref 0.61–1.24)
GFR, Estimated: >60 mL/min (ref >60)
Glucose, Bld: 152 mg/dL — ABNORMAL HIGH (ref 70–99)
Potassium: 4.1 mmol/L (ref 3.5–5.1)
Sodium: 115 mmol/L — CL (ref 135–145)
Total Bilirubin: 1.2 mg/dL (ref 0.0–1.2)
Total Protein: 6.6 g/dL (ref 6.5–8.1)

## 2024-01-24 LAB — BASIC METABOLIC PANEL WITH GFR
Anion gap: 11 (ref 5–15)
BUN: 7 mg/dL — ABNORMAL LOW (ref 8–23)
CO2: 34 mmol/L — ABNORMAL HIGH (ref 22–32)
Calcium: 8.5 mg/dL — ABNORMAL LOW (ref 8.9–10.3)
Chloride: 71 mmol/L — ABNORMAL LOW (ref 98–111)
Creatinine, Ser: 0.58 mg/dL — ABNORMAL LOW (ref 0.61–1.24)
GFR, Estimated: >60 mL/min (ref >60)
Glucose, Bld: 121 mg/dL — ABNORMAL HIGH (ref 70–99)
Potassium: 3.9 mmol/L (ref 3.5–5.1)
Sodium: 116 mmol/L — CL (ref 135–145)

## 2024-01-24 LAB — ETHANOL: Alcohol, Ethyl (B): <10 mg/dL (ref <10)

## 2024-01-24 LAB — MRSA NEXT GEN BY PCR, NASAL: MRSA by PCR Next Gen: NOT DETECTED

## 2024-01-24 LAB — TROPONIN I (HIGH SENSITIVITY)
Troponin I (High Sensitivity): 13 ng/L (ref <18)
Troponin I (High Sensitivity): 15 ng/L (ref <18)

## 2024-01-24 LAB — GLUCOSE, CAPILLARY: Glucose-Capillary: 107 mg/dL — ABNORMAL HIGH (ref 70–99)

## 2024-01-24 MED ORDER — SODIUM CHLORIDE 0.9 % IV SOLN: INTRAVENOUS | Status: AC

## 2024-01-24 MED ORDER — SODIUM CHLORIDE 0.9 % IV SOLN
1.0000 g | INTRAVENOUS | Status: DC
  Administered 2024-01-24: 1 g via INTRAVENOUS
  Filled 2024-01-24: qty 10

## 2024-01-24 MED ORDER — SODIUM CHLORIDE 0.9 % IV SOLN
1.0000 mg | Freq: Once | INTRAVENOUS | Status: AC
  Administered 2024-01-25: 1 mg via INTRAVENOUS
  Filled 2024-01-24: qty 0.2

## 2024-01-24 MED ORDER — ACETAMINOPHEN 325 MG PO TABS
650.0000 mg | ORAL_TABLET | Freq: Four times a day (QID) | ORAL | Status: DC | PRN
  Filled 2024-01-24: qty 2

## 2024-01-24 MED ORDER — CHLORDIAZEPOXIDE HCL 5 MG PO CAPS: 25.0000 mg | ORAL_CAPSULE | Freq: Three times a day (TID) | ORAL | Status: DC

## 2024-01-24 MED ORDER — FENTANYL CITRATE PF 50 MCG/ML IJ SOSY
50.0000 ug | PREFILLED_SYRINGE | Freq: Once | INTRAMUSCULAR | Status: AC
  Administered 2024-01-24: 50 ug via INTRAVENOUS
  Filled 2024-01-24: qty 1

## 2024-01-24 MED ORDER — THIAMINE HCL 100 MG/ML IJ SOLN
100.0000 mg | Freq: Every day | INTRAMUSCULAR | Status: DC
  Administered 2024-01-25: 100 mg via INTRAVENOUS
  Filled 2024-01-24: qty 2

## 2024-01-24 MED ORDER — DIAZEPAM 5 MG/ML IJ SOLN
5.0000 mg | Freq: Four times a day (QID) | INTRAMUSCULAR | Status: DC
  Administered 2024-01-25 (×2): 5 mg via INTRAVENOUS
  Filled 2024-01-24 (×2): qty 2

## 2024-01-24 MED ORDER — FAMOTIDINE 20 MG PO TABS: 20.0000 mg | ORAL_TABLET | Freq: Two times a day (BID) | ORAL | Status: DC

## 2024-01-24 MED ORDER — FENTANYL CITRATE PF 50 MCG/ML IJ SOSY
25.0000 ug | PREFILLED_SYRINGE | Freq: Once | INTRAMUSCULAR | Status: AC
  Administered 2024-01-24: 25 ug via INTRAVENOUS
  Filled 2024-01-24: qty 1

## 2024-01-24 MED ORDER — IOHEXOL 350 MG/ML SOLN
75.0000 mL | Freq: Once | INTRAVENOUS | Status: AC | PRN
  Administered 2024-01-24: 75 mL via INTRAVENOUS

## 2024-01-24 MED ORDER — CHLORDIAZEPOXIDE HCL 5 MG PO CAPS
25.0000 mg | ORAL_CAPSULE | Freq: Three times a day (TID) | ORAL | Status: DC
  Filled 2024-01-24: qty 5

## 2024-01-24 MED ORDER — ACETAMINOPHEN 650 MG RE SUPP
650.0000 mg | Freq: Four times a day (QID) | RECTAL | Status: DC | PRN
  Administered 2024-01-25: 650 mg via RECTAL
  Filled 2024-01-24: qty 1

## 2024-01-24 MED ORDER — HEPARIN SODIUM (PORCINE) 5000 UNIT/ML IJ SOLN
5000.0000 [IU] | Freq: Three times a day (TID) | INTRAMUSCULAR | Status: DC
  Administered 2024-01-24 – 2024-01-28 (×12): 5000 [IU] via SUBCUTANEOUS
  Filled 2024-01-24 (×12): qty 1

## 2024-01-24 MED ORDER — CHLORHEXIDINE GLUCONATE CLOTH 2 % EX PADS
6.0000 | MEDICATED_PAD | Freq: Every day | CUTANEOUS | Status: DC
  Administered 2024-01-24 – 2024-01-25 (×2): 6 via TOPICAL

## 2024-01-24 MED ORDER — DOCUSATE SODIUM 100 MG PO CAPS: 100.0000 mg | ORAL_CAPSULE | Freq: Two times a day (BID) | ORAL | Status: DC | PRN

## 2024-01-24 MED ORDER — METHOCARBAMOL 1000 MG/10ML IJ SOLN: 500.0000 mg | Freq: Four times a day (QID) | INTRAMUSCULAR | Status: DC | PRN

## 2024-01-24 MED ORDER — ONDANSETRON HCL 4 MG/2ML IJ SOLN: 4.0000 mg | Freq: Four times a day (QID) | INTRAMUSCULAR | Status: DC | PRN

## 2024-01-24 MED ORDER — FAMOTIDINE 20 MG PO TABS
20.0000 mg | ORAL_TABLET | Freq: Two times a day (BID) | ORAL | Status: DC
  Filled 2024-01-24: qty 1

## 2024-01-24 MED ORDER — POLYETHYLENE GLYCOL 3350 17 G PO PACK: 17.0000 g | PACK | Freq: Every day | ORAL | Status: DC | PRN

## 2024-01-24 MED ORDER — PANTOPRAZOLE SODIUM 40 MG IV SOLR
40.0000 mg | INTRAVENOUS | Status: DC
  Administered 2024-01-25: 40 mg via INTRAVENOUS
  Filled 2024-01-24: qty 10

## 2024-01-24 NOTE — ED Triage Notes (Signed)
 Pt BIB [facility] from home as unresponsive.  Wife found pt down in garage laying in fetal position in puddle of urine.  Was unresponsive for EMS on scene and was breathing.  6-10 breaths a minute.  Became more responsive en route. A&O x4 on arrival. LKW 1500.  166/82 96% HR 74 CBG 121

## 2024-01-24 NOTE — H&P (Addendum)
 NAME:  Dustin Park, MRN:  [id number], DOB:  [birth year], LOS: 0 ADMISSION DATE:  01/24/2024, CONSULTATION DATE:  4/21 REFERRING MD:  Dr. 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 Also has an alcohol history. 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 Wife reports frailty and failure to thrive over about the past year. Barely gets out of the recliner. Today he was in his usual state of health until his wife found him at the bottom of a three step flight of stairs in urine. She had seen him minutes prior and he was fine. He was transported via EMS to [facility] ED. He was poorly responsive upon arrival to the ED but did improve some. Laboratory evaluation was concerning NA 115 and WBC 17.8. PCCM was called to evaluate in the setting of severe hyponatremia.   Pertinent  Medical History   has a past medical history of Hypertension.   Significant Hospital Events: Including procedures, antibiotic start and stop dates in addition to other pertinent events   4/21 admit to [facility] for hyponatremia  Interim History / Subjective:    Objective   Blood pressure 131/77, pulse 70, temperature 97.7 F (36.5 C), temperature source Oral, resp. rate (!) 22, SpO2 98%.       No intake or output data in the 24 hours ending 01/24/24 1830 There were no vitals filed for this visit.  Examination: General: Frail elderly appearing male  HENT: [place]/AT, kyphosis, no JVD Lungs: Diminished. Clear.  Cardiovascular: RRR, no MRG Abdomen: Soft, non-tender, non-distended Extremities: No acute deformity. No edema Neuro: Alert, oriented, non-focal. Moving all extremities with good strength.    Resolved Hospital Problem list     Assessment & Plan:   Hyponatremia: likely hypovolemic, chronic.  - replace with Normal saline - Trend  sodium every 4 hours  Possible seizure - Correct sodium - Observe for signs of alcohol withdrawal - Start librium  - CIWA ativan   Trauma fall with known spine compression fractures - Pan scan pending - PRN fentanyl   Hypertension: - holding home medications for now - PRN labetalol  Leukocytosis: left shift - Cover aspiration.    Best Practice (right click and "Reselect all SmartList Selections" daily)   Diet/type: NPO DVT prophylaxis prophylactic heparin   Pressure ulcer(s): pressure ulcer assessment deferred  GI prophylaxis: H2B Lines: N/A Foley:  N/A Code Status:  full code Last date of multidisciplinary goals of care discussion [ ]   Labs   CBC: Recent Labs  Lab 01/24/24 1617  WBC 17.8*  NEUTROABS 16.2*  HGB 14.6  HCT 39.3  MCV 94.2  PLT 192    Basic Metabolic Panel: Recent Labs  Lab 01/24/24 1617  NA 115*  K 4.1  CL 73*  CO2 29  GLUCOSE 152*  BUN 8  CREATININE 0.49*  CALCIUM 8.6*   GFR: CrCl cannot be calculated (Unknown ideal weight.). Recent Labs  Lab 01/24/24 1617  WBC 17.8*    Liver Function Tests: Recent Labs  Lab 01/24/24 1617  AST 36  ALT 16  ALKPHOS 114  BILITOT 1.2  PROT 6.6  ALBUMIN 3.2*   No results for input(s): "LIPASE", "AMYLASE" in the last 168 hours. No results for input(s): "AMMONIA" in the last 168 hours.  ABG No results found for: "PHART", "PCO2ART", "PO2ART", "HCO3", "TCO2", "ACIDBASEDEF", "O2SAT"   Coagulation Profile:  No results for input(s): "INR", "PROTIME" in the last 168 hours.  Cardiac Enzymes: No results for input(s): "CKTOTAL", "CKMB", "CKMBINDEX", "TROPONINI" in the last 168 hours.  HbA1C: No results found for: "HGBA1C"  CBG: No results for input(s): "GLUCAP" in the last 168 hours.  Review of Systems:   Bolds are positive  Constitutional: weight loss, gain, night sweats, Fevers, chills, fatigue .  HEENT: headaches, Sore throat, sneezing, nasal congestion, post nasal drip, Difficulty  swallowing, Tooth/dental problems, visual complaints visual changes, ear ache CV:  chest pain, radiates:=,Orthopnea, PND, swelling in lower extremities, dizziness, palpitations, syncope.  GI  heartburn, indigestion, abdominal pain, nausea, vomiting, diarrhea, change in bowel habits, loss of appetite, bloody stools.  Resp: cough, productive: , hemoptysis, dyspnea, chest pain, pleuritic.  Skin: rash or itching or icterus GU: dysuria, change in color of urine, urgency or frequency. flank pain, hematuria  MS: back pain or swelling. decreased range of motion  Psych: change in mood or affect. depression or anxiety.  Neuro: difficulty with speech, weakness, numbness, ataxia    Past Medical History:  He,  has a past medical history of Hypertension.   Surgical History:   Past Surgical History:  Procedure Laterality Date   CHOLECYSTECTOMY     TONSILLECTOMY       Social History:   reports that he has been smoking. He has never used smokeless tobacco. He reports current alcohol use. He reports that he does not use drugs.   Family History:  His family history is not on file.   Allergies Allergies  Allergen Reactions   Penicillins      Home Medications  Prior to Admission medications   Medication Sig Start Date End Date Taking? Authorizing Provider  amLODipine -benazepril (LOTREL) 5-10 MG capsule Take 1 capsule by mouth daily.    [provider]  atenolol (TENORMIN) 50 MG tablet Take 50 mg by mouth daily.    [provider]  carvedilol  (COREG ) 6.25 MG tablet Take 6.25 mg by mouth 2 (two) times daily with a meal.    [provider]  dicyclomine (BENTYL) 10 MG capsule Take 10 mg by mouth 4 (four) times daily -  before meals and at bedtime.    [provider]  finasteride (PROPECIA) 1 MG tablet Take 1 mg by mouth daily.    [provider]  fluticasone (FLONASE) 50 MCG/ACT nasal spray Place into both nostrils daily.    [provider]   gabapentin  (NEURONTIN ) 300 MG capsule Take 1 capsule (300 mg total) by mouth 3 (three) times daily as needed. 11/30/23   Corey, Evan S, MD  pravastatin (PRAVACHOL) 10 MG tablet Take 10 mg by mouth daily.    [provider]  sertraline (ZOLOFT) 100 MG tablet Take 100 mg by mouth daily.    [provider]  tiZANidine  (ZANAFLEX ) 2 MG tablet Take by mouth every 6 (six) hours as needed for muscle spasms.    [provider]     Critical care time: 36 minutes    Roz Cornelia, AGACNP-BC [facility]  See Amion for personal pager PCCM on call pager [phone] until 7pm. Please call Elink 7p-7a. [phone]  01/24/2024 6:45 PM

## 2024-01-24 NOTE — Progress Notes (Signed)
 This RN attempted to insert NG tube, 1st attempt via right nare and re attempted with left nare.   Attempted with second RN, all NG insertions unsuccessful.  E-link notified.

## 2024-01-24 NOTE — ED Provider Notes (Signed)
  EMERGENCY DEPARTMENT AT [facility] Provider Note   CSN: [id number] Arrival date & time: 01/24/24  1555     History  No chief complaint on file.   Dustin Park is a 78 y.o. male.  HPI Patient presents with unresponsive episode.  Patient cannot really relate what happened.  States he was out in the garage and was can go to the bathroom found laying unresponsive in a puddle of urine.  EMSArrived patient became more responsive.  Patient's wife later arrived here and told me that he had been fine at home she went inside to change close and came back and he was unresponsive.  Does not know exact what happened.  Thinks he may have fallen on the stairs.  States he likely has been drinking.   Past Medical History:  Diagnosis Date   Hypertension     Home Medications Prior to Admission medications   Medication Sig Start Date End Date Taking? Authorizing Provider  amLODipine -benazepril (LOTREL) 5-10 MG capsule Take 1 capsule by mouth daily.   Yes [provider]  carvedilol  (COREG ) 12.5 MG tablet Take 12.5 mg by mouth 2 (two) times daily with a meal.   Yes [provider]  fluticasone (FLONASE) 50 MCG/ACT nasal spray Place 2 sprays into both nostrils daily.   Yes [provider]  gabapentin  (NEURONTIN ) 300 MG capsule Take 1 capsule (300 mg total) by mouth 3 (three) times daily as needed. 11/30/23  Yes Syliva Even, MD  omeprazole  (PRILOSEC) 40 MG capsule Take 40 mg by mouth daily.   Yes [provider]  tiZANidine  (ZANAFLEX ) 2 MG tablet Take 2 mg by mouth at bedtime.   Yes [provider]      Allergies    Penicillins and Latex    Review of Systems   Review of Systems  Physical Exam Updated Vital Signs BP 113/77   Pulse 73   Temp 97.7 F (36.5 C) (Oral)   Resp 20   SpO2 98%  Physical Exam Vitals and nursing note reviewed.  HENT:     Head: Atraumatic.  Eyes:     Pupils: Pupils are equal, round, and  reactive to light.  Pulmonary:     Comments: Some tenderness on the right posterior chest wall. Musculoskeletal:        General: No tenderness.     Cervical back: Neck supple.  Skin:    General: Skin is warm.  Neurological:     Mental Status: He is alert.     Comments: Awake and was extremities, but confused to events.   Does have lumbar tenderness.  ED Results / Procedures / Treatments   Labs (all labs ordered are listed, but only abnormal results are displayed) Labs Reviewed  COMPREHENSIVE METABOLIC PANEL WITH GFR - Abnormal; Notable for the following components:      Result Value   Sodium 115 (*)    Chloride 73 (*)    Glucose, Bld 152 (*)    Creatinine, Ser 0.49 (*)    Calcium 8.6 (*)    Albumin 3.2 (*)    All other components within normal limits  CBC WITH DIFFERENTIAL/PLATELET - Abnormal; Notable for the following components:   WBC 17.8 (*)    RBC 4.17 (*)    MCH 35.0 (*)    MCHC 37.2 (*)    RDW 11.2 (*)    Neutro Abs 16.2 (*)    Lymphs Abs 0.5 (*)    Abs Immature Granulocytes  0.13 (*)    All other components within normal limits  BASIC METABOLIC PANEL WITH GFR - Abnormal; Notable for the following components:   Sodium 116 (*)    Chloride 71 (*)    CO2 34 (*)    Glucose, Bld 121 (*)    BUN 7 (*)    Creatinine, Ser 0.58 (*)    Calcium 8.5 (*)    All other components within normal limits  MRSA NEXT GEN BY PCR, NASAL  ETHANOL  URINALYSIS, W/ REFLEX TO CULTURE (INFECTION SUSPECTED)  BASIC METABOLIC PANEL WITH GFR  BASIC METABOLIC PANEL WITH GFR  BASIC METABOLIC PANEL WITH GFR  TROPONIN I (HIGH SENSITIVITY)  TROPONIN I (HIGH SENSITIVITY)    EKG EKG Interpretation Date/Time:  Monday January 24 2024 16:12:02 EDT Ventricular Rate:  76 PR Interval:  204 QRS Duration:  125 QT Interval:  437 QTC Calculation: 492 R Axis:   266  Text Interpretation: Sinus rhythm Right bundle branch block Inferior infarct, old Artifact in lead(s) II III aVR aVF V1 V2 V3 V4 V5 V6  RBBB is new since 22 years ago Confirmed by Mozell Arias [phone]) on 01/24/2024 5:32:56 PM  Radiology CT Angio Chest PE W and/or Wo Contrast Result Date: 01/24/2024 CLINICAL DATA:  Abdominal trauma, blunt; Pulmonary embolism (PE) suspected, high prob. Trauma blunt. obtunded. Chest pain. EXAM: CT ANGIOGRAPHY CHEST CT ABDOMEN AND PELVIS WITH CONTRAST TECHNIQUE: Multidetector CT imaging of the chest was performed using the standard protocol during bolus administration of intravenous contrast. Multiplanar CT image reconstructions and MIPs were obtained to evaluate the vascular anatomy. Multidetector CT imaging of the abdomen and pelvis was performed using the standard protocol during bolus administration of intravenous contrast. RADIATION DOSE REDUCTION: This exam was performed according to the departmental dose-optimization program which includes automated exposure control, adjustment of the mA and/or kV according to patient size and/or use of iterative reconstruction technique. CONTRAST:  75mL OMNIPAQUE  IOHEXOL  350 MG/ML SOLN COMPARISON:  None Available. FINDINGS: CTA CHEST FINDINGS Cardiovascular: Satisfactory opacification of the pulmonary arteries to the segmental level. Limited evaluation of subsegmental level due to motion artifact. No evidence of pulmonary embolism. Normal heart size. No pericardial effusion. Four-vessel coronary calcification. Severe atherosclerotic plaque. Mediastinum/Nodes: No pneumomediastinum. No mediastinal hematoma. The esophagus is unremarkable. The thyroid is unremarkable. The central airways are patent. No mediastinal, hilar, or axillary lymphadenopathy. Lungs/Pleura: Centrilobular emphysematous changes. Right lower lobe peribronchovascular ground-glass and consolidative airspace opacities. No pulmonary nodule. No pulmonary mass. No pulmonary contusion or laceration. No pneumatocele formation. No pleural effusion. No pneumothorax. No hemothorax. Musculoskeletal/Chest wall: No  chest wall mass. No acute rib or sternal fracture. Likely acute T9 compression fracture with at least 40% vertebral body height loss. Similar-appearing chronic T11-T12 compression fractures. Multilevel old healed bilateral rib fractures. ABDOMEN / PELVIS FINDINGS: Hepatobiliary: Borderline enlarged measuring up to 19 cm. Nonspecific subcentimeter hypodensities too small to characterize. No laceration or subcapsular hematoma. Status post cholecystectomy.  No biliary ductal dilatation. Pancreas: Question 2 x 3 hypodense mass of the uncinate process poorly visualized in the setting of motion artifact (3:36). Otherwise normal pancreatic contour. No main pancreatic duct dilatation. Spleen: Not enlarged. No focal lesion. No laceration, subcapsular hematoma, or vascular injury. Adrenals/Urinary Tract: No nodularity bilaterally. Bilateral kidneys enhance symmetrically. No hydronephrosis. No contusion, laceration, or subcapsular hematoma. No injury to the vascular structures or collecting systems. No hydroureter. The urinary bladder is unremarkable. Stomach/Bowel: No small or large bowel wall thickening or dilatation. The appendix is unremarkable. Vasculature/Lymphatics: Severe atherosclerotic  plaque. No abdominal aorta or iliac aneurysm. No active contrast extravasation or pseudoaneurysm. No abdominal, pelvic, inguinal lymphadenopathy. Reproductive: Normal. Other: No simple free fluid ascites. No pneumoperitoneum. No hemoperitoneum. No mesenteric hematoma identified. No organized fluid collection. Musculoskeletal: No significant soft tissue hematoma. Severe degenerative changes of the bilateral hips. No acute pelvic fracture. Please see separately dictated CT lumbar spine 01/24/2024 Other ports and devices: None. Review of the MIP images confirms the above findings. IMPRESSION: 1. No pulmonary embolus with limited evaluation of the subsegmental level. 2. Right lower lobe peribronchovascular ground-glass and consolidative  airspace opacities. 3. Likely acute T9 compression fracture with at least 40% vertebral body height loss. 4. No acute intrathoracic, intra-abdominal, intrapelvic traumatic injury. 5. Similar-appearing chronic T11-T12 compression fractures. 6. Please see separately dictated CT lumbar spine 01/24/2024. 7. Aortic Atherosclerosis (ICD10-I70.0) and Emphysema (ICD10-J43.9). 8. Question 2 x 3 hypodense mass of the uncinate process poorly visualized in the setting of motion artifact. When the patient is clinically stable and able to follow directions and hold their breath (preferably as an outpatient) further evaluation with dedicated MRI pancreatic protocol should be considered. Electronically Signed   By: Morgane  Naveau M.D.   On: 01/24/2024 20:43   CT ABDOMEN PELVIS W CONTRAST Result Date: 01/24/2024 CLINICAL DATA:  Abdominal trauma, blunt; Pulmonary embolism (PE) suspected, high prob. Trauma blunt. obtunded. Chest pain. EXAM: CT ANGIOGRAPHY CHEST CT ABDOMEN AND PELVIS WITH CONTRAST TECHNIQUE: Multidetector CT imaging of the chest was performed using the standard protocol during bolus administration of intravenous contrast. Multiplanar CT image reconstructions and MIPs were obtained to evaluate the vascular anatomy. Multidetector CT imaging of the abdomen and pelvis was performed using the standard protocol during bolus administration of intravenous contrast. RADIATION DOSE REDUCTION: This exam was performed according to the departmental dose-optimization program which includes automated exposure control, adjustment of the mA and/or kV according to patient size and/or use of iterative reconstruction technique. CONTRAST:  75mL OMNIPAQUE  IOHEXOL  350 MG/ML SOLN COMPARISON:  None Available. FINDINGS: CTA CHEST FINDINGS Cardiovascular: Satisfactory opacification of the pulmonary arteries to the segmental level. Limited evaluation of subsegmental level due to motion artifact. No evidence of pulmonary embolism. Normal heart  size. No pericardial effusion. Four-vessel coronary calcification. Severe atherosclerotic plaque. Mediastinum/Nodes: No pneumomediastinum. No mediastinal hematoma. The esophagus is unremarkable. The thyroid is unremarkable. The central airways are patent. No mediastinal, hilar, or axillary lymphadenopathy. Lungs/Pleura: Centrilobular emphysematous changes. Right lower lobe peribronchovascular ground-glass and consolidative airspace opacities. No pulmonary nodule. No pulmonary mass. No pulmonary contusion or laceration. No pneumatocele formation. No pleural effusion. No pneumothorax. No hemothorax. Musculoskeletal/Chest wall: No chest wall mass. No acute rib or sternal fracture. Likely acute T9 compression fracture with at least 40% vertebral body height loss. Similar-appearing chronic T11-T12 compression fractures. Multilevel old healed bilateral rib fractures. ABDOMEN / PELVIS FINDINGS: Hepatobiliary: Borderline enlarged measuring up to 19 cm. Nonspecific subcentimeter hypodensities too small to characterize. No laceration or subcapsular hematoma. Status post cholecystectomy.  No biliary ductal dilatation. Pancreas: Question 2 x 3 hypodense mass of the uncinate process poorly visualized in the setting of motion artifact (3:36). Otherwise normal pancreatic contour. No main pancreatic duct dilatation. Spleen: Not enlarged. No focal lesion. No laceration, subcapsular hematoma, or vascular injury. Adrenals/Urinary Tract: No nodularity bilaterally. Bilateral kidneys enhance symmetrically. No hydronephrosis. No contusion, laceration, or subcapsular hematoma. No injury to the vascular structures or collecting systems. No hydroureter. The urinary bladder is unremarkable. Stomach/Bowel: No small or large bowel wall thickening or dilatation. The appendix is unremarkable. Vasculature/Lymphatics:  Severe atherosclerotic plaque. No abdominal aorta or iliac aneurysm. No active contrast extravasation or pseudoaneurysm. No  abdominal, pelvic, inguinal lymphadenopathy. Reproductive: Normal. Other: No simple free fluid ascites. No pneumoperitoneum. No hemoperitoneum. No mesenteric hematoma identified. No organized fluid collection. Musculoskeletal: No significant soft tissue hematoma. Severe degenerative changes of the bilateral hips. No acute pelvic fracture. Please see separately dictated CT lumbar spine 01/24/2024 Other ports and devices: None. Review of the MIP images confirms the above findings. IMPRESSION: 1. No pulmonary embolus with limited evaluation of the subsegmental level. 2. Right lower lobe peribronchovascular ground-glass and consolidative airspace opacities. 3. Likely acute T9 compression fracture with at least 40% vertebral body height loss. 4. No acute intrathoracic, intra-abdominal, intrapelvic traumatic injury. 5. Similar-appearing chronic T11-T12 compression fractures. 6. Please see separately dictated CT lumbar spine 01/24/2024. 7. Aortic Atherosclerosis (ICD10-I70.0) and Emphysema (ICD10-J43.9). 8. Question 2 x 3 hypodense mass of the uncinate process poorly visualized in the setting of motion artifact. When the patient is clinically stable and able to follow directions and hold their breath (preferably as an outpatient) further evaluation with dedicated MRI pancreatic protocol should be considered. Electronically Signed   By: Morgane  Naveau M.D.   On: 01/24/2024 20:43   CT L-SPINE NO CHARGE Result Date: 01/24/2024 CLINICAL DATA:  Trauma blunt. obtunded EXAM: CT LUMBAR SPINE WITHOUT CONTRAST TECHNIQUE: Multidetector CT imaging of the lumbar spine was performed without intravenous contrast administration. Multiplanar CT image reconstructions were also generated. RADIATION DOSE REDUCTION: This exam was performed according to the departmental dose-optimization program which includes automated exposure control, adjustment of the mA and/or kV according to patient size and/or use of iterative reconstruction  technique. COMPARISON:  MRI lumbar spine 12/23/2023, ct lumbar 10/30/20 FINDINGS: Segmentation: 5 lumbar type vertebrae. Alignment: Similar straightening of the lumbar spine. Vertebrae: Similar-appearing T11 and T12 anterior wedge compression fracture. Similar-appearing moderate L1 compression fracture. Similar-appearing severe L2 and L4 compression fractures. Severe multilevel degenerative changes of the spine. No definite acute fracture or focal pathologic process. Paraspinal and other soft tissues: Negative. Disc levels: Multilevel intervertebral disc space vacuum phenomenon. Other: Severe atherosclerotic plaque. Bilateral hip degenerative changes. Please see separately dictated CT chest abdomen pelvis 01/24/2024 IMPRESSION: 1. No acute displaced fracture or traumatic listhesis of the lumbar spine. 2. Similar-appearing T11 and T12 anterior wedge compression fracture. Similar-appearing moderate L1 compression fracture. Similar-appearing severe L2 and L4 compression fractures. Correlate with point tenderness to palpation to evaluate for an acute component. Electronically Signed   By: Morgane  Naveau M.D.   On: 01/24/2024 20:29   CT Head Wo Contrast Result Date: 01/24/2024 CLINICAL DATA:  Head trauma, moderate-severe; Neck trauma, intoxicated or obtunded (Age >= 16y) EXAM: CT HEAD WITHOUT CONTRAST CT CERVICAL SPINE WITHOUT CONTRAST TECHNIQUE: Multidetector CT imaging of the head and cervical spine was performed following the standard protocol without intravenous contrast. Multiplanar CT image reconstructions of the cervical spine were also generated. RADIATION DOSE REDUCTION: This exam was performed according to the departmental dose-optimization program which includes automated exposure control, adjustment of the mA and/or kV according to patient size and/or use of iterative reconstruction technique. COMPARISON:  None Available. FINDINGS: CT HEAD FINDINGS Brain: No evidence of large-territorial acute infarction.  No parenchymal hemorrhage. No mass lesion. No extra-axial collection. No mass effect or midline shift. No hydrocephalus. Basilar cisterns are patent. Vascular: No hyperdense vessel. Atherosclerotic calcifications are present within the cavernous internal carotid and vertebral arteries. Skull: No acute fracture or focal lesion. Sinuses/Orbits: Paranasal sinuses and mastoid air cells are clear.  The orbits are unremarkable. Other: None. CT CERVICAL SPINE FINDINGS Alignment: Normal. Skull base and vertebrae: Multilevel moderate degenerative changes of the spine. No severe osseous neural foraminal or central canal stenosis. No acute fracture. No aggressive appearing focal osseous lesion or focal pathologic process. Soft tissues and spinal canal: No prevertebral fluid or swelling. No visible canal hematoma. Upper chest: Centrilobular emphysematous changes. Other: Atherosclerotic plaque of the aortic arch and its main branches. IMPRESSION: 1. No acute intracranial abnormality. 2. No acute displaced fracture or traumatic listhesis of the cervical spine. 3. Aortic Atherosclerosis (ICD10-I70.0) and Emphysema (ICD10-J43.9). Electronically Signed   By: Morgane  Naveau M.D.   On: 01/24/2024 20:13   CT Cervical Spine Wo Contrast Result Date: 01/24/2024 CLINICAL DATA:  Head trauma, moderate-severe; Neck trauma, intoxicated or obtunded (Age >= 16y) EXAM: CT HEAD WITHOUT CONTRAST CT CERVICAL SPINE WITHOUT CONTRAST TECHNIQUE: Multidetector CT imaging of the head and cervical spine was performed following the standard protocol without intravenous contrast. Multiplanar CT image reconstructions of the cervical spine were also generated. RADIATION DOSE REDUCTION: This exam was performed according to the departmental dose-optimization program which includes automated exposure control, adjustment of the mA and/or kV according to patient size and/or use of iterative reconstruction technique. COMPARISON:  None Available. FINDINGS: CT HEAD  FINDINGS Brain: No evidence of large-territorial acute infarction. No parenchymal hemorrhage. No mass lesion. No extra-axial collection. No mass effect or midline shift. No hydrocephalus. Basilar cisterns are patent. Vascular: No hyperdense vessel. Atherosclerotic calcifications are present within the cavernous internal carotid and vertebral arteries. Skull: No acute fracture or focal lesion. Sinuses/Orbits: Paranasal sinuses and mastoid air cells are clear. The orbits are unremarkable. Other: None. CT CERVICAL SPINE FINDINGS Alignment: Normal. Skull base and vertebrae: Multilevel moderate degenerative changes of the spine. No severe osseous neural foraminal or central canal stenosis. No acute fracture. No aggressive appearing focal osseous lesion or focal pathologic process. Soft tissues and spinal canal: No prevertebral fluid or swelling. No visible canal hematoma. Upper chest: Centrilobular emphysematous changes. Other: Atherosclerotic plaque of the aortic arch and its main branches. IMPRESSION: 1. No acute intracranial abnormality. 2. No acute displaced fracture or traumatic listhesis of the cervical spine. 3. Aortic Atherosclerosis (ICD10-I70.0) and Emphysema (ICD10-J43.9). Electronically Signed   By: Morgane  Naveau M.D.   On: 01/24/2024 20:13   DG Pelvis Portable Result Date: 01/24/2024 CLINICAL DATA:  chest pain, fall EXAM: PORTABLE PELVIS 1-2 VIEWS COMPARISON:  CT abdomen pelvis 01/24/2024 FINDINGS: There is no evidence of pelvic fracture or diastasis. Moderate to severe degenerative changes of bilateral hips. No pelvic bone lesions are seen. Atherosclerotic plaque. IMPRESSION: Negative for acute traumatic injury. Electronically Signed   By: Morgane  Naveau M.D.   On: 01/24/2024 20:10   DG Chest Portable 1 View Result Date: 01/24/2024 CLINICAL DATA:  chest pain, fall EXAM: PORTABLE CHEST 1 VIEW COMPARISON:  CT angiography chest 01/24/2024 FINDINGS: The heart and mediastinal contours are within normal  limits. Atherosclerotic plaque. Emphysematous changes. Right lower lung zone airspace and interstitial opacities. No pulmonary edema. No pleural effusion. No pneumothorax. No acute osseous abnormality.  Old healed bilateral rib fractures. IMPRESSION: 1. Right lower lung zone airspace and interstitial opacities consistent with infection/inflammation. Followup PA and lateral chest X-ray is recommended in 3-4 weeks following therapy to ensure resolution. 2. Aortic Atherosclerosis (ICD10-I70.0) and Emphysema (ICD10-J43.9). Electronically Signed   By: Morgane  Naveau M.D.   On: 01/24/2024 20:08    Procedures Procedures    Medications Ordered in ED Medications  docusate sodium  (  COLACE) capsule 100 mg (has no administration in time range)  polyethylene glycol (MIRALAX  / GLYCOLAX ) packet 17 g (has no administration in time range)  heparin  injection 5,000 Units (has no administration in time range)  0.9 %  sodium chloride  infusion ( Intravenous New Bag/Given 01/24/24 2013)  ondansetron  (ZOFRAN ) injection 4 mg (has no administration in time range)  thiamine  (VITAMIN B1) injection 100 mg (has no administration in time range)  folic acid  1 mg in sodium chloride  0.9 % 50 mL IVPB (has no administration in time range)  cefTRIAXone  (ROCEPHIN ) 1 g in sodium chloride  0.9 % 100 mL IVPB (1 g Intravenous New Bag/Given 01/24/24 2032)  Chlorhexidine  Gluconate Cloth 2 % PADS 6 each (6 each Topical Given 01/24/24 2000)  chlordiazePOXIDE  (LIBRIUM ) capsule 25 mg (0 mg Per Tube Hold 01/24/24 2254)  famotidine  (PEPCID ) tablet 20 mg (20 mg Per Tube Not Given 01/24/24 2258)  fentaNYL  (SUBLIMAZE ) injection 25 mcg (25 mcg Intravenous Given 01/24/24 1804)  fentaNYL  (SUBLIMAZE ) injection 50 mcg (50 mcg Intravenous Given 01/24/24 1840)  iohexol  (OMNIPAQUE ) 350 MG/ML injection 75 mL (75 mLs Intravenous Contrast Given 01/24/24 1851)    ED Course/ Medical Decision Making/ A&P                                 Medical Decision  Making Amount and/or Complexity of Data Reviewed Labs: ordered. Radiology: ordered.  Risk Prescription drug management. Decision regarding hospitalization.   Patient with mental status change.  Potential fall.  Does have back pain and chest pain.  Is hypoxic upon arrival.  Was incontinent of urine.  Does not remember event.  Differential diagnosis along with include causes such as mechanical fall, syncope, seizure.  Patient reportedly is heavy drinker.  Alcohol level here is negative.  Also was severely hyponatremic at 115.  Does raise risk of seizure although mental status is improving now.  With hyponatremia and potential seizure discussed with ICU, will admit patient.  CRITICAL CARE Performed by: Mozell Arias Total critical care time: 30 nodes been over there for a while so minutes Critical care time was exclusive of separately billable procedures and treating other patients. Critical care was necessary to treat or prevent imminent or life-threatening deterioration. Critical care was time spent personally by me on the following activities: development of treatment plan with patient and/or surrogate as well as nursing, discussions with consultants, evaluation of patient's response to treatment, examination of patient, obtaining history from patient or surrogate, ordering and performing treatments and interventions, ordering and review of laboratory studies, ordering and review of radiographic studies, pulse oximetry and re-evaluation of patient's condition.         Final Clinical Impression(s) / ED Diagnoses Final diagnoses:  Hyponatremia    Rx / DC Orders ED Discharge Orders     None         Mozell Arias, MD 01/24/24 2303

## 2024-01-24 NOTE — Progress Notes (Signed)
 Wife request wedding ring to be taken home with her. Wedding ring placed in sterile cup and given to wife.

## 2024-01-24 NOTE — Progress Notes (Addendum)
 eLink Physician-Brief Progress Note Patient Name: Dustin Park DOB: 27-Jun-1946 MRN: [id number]   Date of Service  01/24/2024  HPI/Events of Note  78 y o M with HTN and multilevel vertebral compression fracture with chronic pain who presented after he was found down status post fall hitting his head.  Admitted to the hospital with severe hyponatremia and possible seizure secondary to alcohol versus hyponatremia.  Patient is tachypneic, normotensive and saturating 95% on 4 L..  Patient has severe hyponatremia (115 at 1617), low creatinine, leukocytosis with fairly unremarkable CT imaging.  eICU Interventions  Patient has inability to safely swallow.  Will place NG tube for meds  Maintain ceftriaxone   Maintain sodium chloride  100 cc an hour, trend BMP every 4 hours  DVT prophylaxis with heparin  GI prophylaxis with famotidine    2326 -patient unable to keep the NG tube down due to coughing.  Unable to maintain enteral access.  Switch Librium  to diazepam  IV.  Requesting pain meds-not on opiates at home, add as needed methocarbamol  and acetaminophen   Intervention Category Evaluation Type: New Patient Evaluation  [name] 01/24/2024, 9:00 PM

## 2024-01-25 ENCOUNTER — Ambulatory Visit: Admitting: Family Medicine

## 2024-01-25 ENCOUNTER — Telehealth: Payer: Self-pay | Admitting: Family Medicine

## 2024-01-25 LAB — BASIC METABOLIC PANEL WITH GFR
Anion gap: 11 (ref 5–15)
Anion gap: 11 (ref 5–15)
Anion gap: 11 (ref 5–15)
Anion gap: 8 (ref 5–15)
Anion gap: 9 (ref 5–15)
BUN: 6 mg/dL — ABNORMAL LOW (ref 8–23)
BUN: 7 mg/dL — ABNORMAL LOW (ref 8–23)
BUN: 8 mg/dL (ref 8–23)
BUN: <5 mg/dL — ABNORMAL LOW (ref 8–23)
BUN: <5 mg/dL — ABNORMAL LOW (ref 8–23)
CO2: 33 mmol/L — ABNORMAL HIGH (ref 22–32)
CO2: 34 mmol/L — ABNORMAL HIGH (ref 22–32)
CO2: 34 mmol/L — ABNORMAL HIGH (ref 22–32)
CO2: 35 mmol/L — ABNORMAL HIGH (ref 22–32)
CO2: 35 mmol/L — ABNORMAL HIGH (ref 22–32)
Calcium: 8.3 mg/dL — ABNORMAL LOW (ref 8.9–10.3)
Calcium: 8.4 mg/dL — ABNORMAL LOW (ref 8.9–10.3)
Calcium: 8.5 mg/dL — ABNORMAL LOW (ref 8.9–10.3)
Calcium: 8.5 mg/dL — ABNORMAL LOW (ref 8.9–10.3)
Calcium: 8.9 mg/dL (ref 8.9–10.3)
Chloride: 73 mmol/L — ABNORMAL LOW (ref 98–111)
Chloride: 77 mmol/L — ABNORMAL LOW (ref 98–111)
Chloride: 77 mmol/L — ABNORMAL LOW (ref 98–111)
Chloride: 78 mmol/L — ABNORMAL LOW (ref 98–111)
Chloride: 80 mmol/L — ABNORMAL LOW (ref 98–111)
Creatinine, Ser: 0.41 mg/dL — ABNORMAL LOW (ref 0.61–1.24)
Creatinine, Ser: 0.44 mg/dL — ABNORMAL LOW (ref 0.61–1.24)
Creatinine, Ser: 0.45 mg/dL — ABNORMAL LOW (ref 0.61–1.24)
Creatinine, Ser: 0.47 mg/dL — ABNORMAL LOW (ref 0.61–1.24)
Creatinine, Ser: 0.48 mg/dL — ABNORMAL LOW (ref 0.61–1.24)
GFR, Estimated: >60 mL/min (ref >60)
GFR, Estimated: >60 mL/min (ref >60)
GFR, Estimated: >60 mL/min (ref >60)
GFR, Estimated: >60 mL/min (ref >60)
GFR, Estimated: >60 mL/min (ref >60)
Glucose, Bld: 101 mg/dL — ABNORMAL HIGH (ref 70–99)
Glucose, Bld: 102 mg/dL — ABNORMAL HIGH (ref 70–99)
Glucose, Bld: 83 mg/dL (ref 70–99)
Glucose, Bld: 84 mg/dL (ref 70–99)
Glucose, Bld: 86 mg/dL (ref 70–99)
Potassium: 3.7 mmol/L (ref 3.5–5.1)
Potassium: 3.8 mmol/L (ref 3.5–5.1)
Potassium: 3.9 mmol/L (ref 3.5–5.1)
Potassium: 4.1 mmol/L (ref 3.5–5.1)
Potassium: 4.4 mmol/L (ref 3.5–5.1)
Sodium: 118 mmol/L — CL (ref 135–145)
Sodium: 119 mmol/L — CL (ref 135–145)
Sodium: 121 mmol/L — ABNORMAL LOW (ref 135–145)
Sodium: 123 mmol/L — ABNORMAL LOW (ref 135–145)
Sodium: 125 mmol/L — ABNORMAL LOW (ref 135–145)

## 2024-01-25 LAB — GLUCOSE, CAPILLARY
Glucose-Capillary: 121 mg/dL — ABNORMAL HIGH (ref 70–99)
Glucose-Capillary: 94 mg/dL (ref 70–99)
Glucose-Capillary: 98 mg/dL (ref 70–99)
Glucose-Capillary: 73 mg/dL (ref 70–99)
Glucose-Capillary: 89 mg/dL (ref 70–99)
Glucose-Capillary: 134 mg/dL — ABNORMAL HIGH (ref 70–99)

## 2024-01-25 MED ORDER — POTASSIUM CHLORIDE 10 MEQ/100ML IV SOLN
10.0000 meq | INTRAVENOUS | Status: AC
  Administered 2024-01-25 (×4): 10 meq via INTRAVENOUS
  Filled 2024-01-25 (×4): qty 100

## 2024-01-25 MED ORDER — METHOCARBAMOL 1000 MG/10ML IJ SOLN
500.0000 mg | Freq: Four times a day (QID) | INTRAMUSCULAR | Status: DC | PRN
  Administered 2024-01-25 – 2024-01-26 (×2): 500 mg via INTRAVENOUS
  Filled 2024-01-25 (×2): qty 10

## 2024-01-25 MED ORDER — LORAZEPAM 1 MG PO TABS: 1.0000 mg | ORAL_TABLET | ORAL | Status: AC | PRN

## 2024-01-25 MED ORDER — OMEPRAZOLE 20 MG PO TBDD
40.0000 mg | DELAYED_RELEASE_TABLET | Freq: Every day | ORAL | Status: DC
  Administered 2024-01-26 – 2024-01-28 (×3): 40 mg via ORAL
  Filled 2024-01-25 (×3): qty 2

## 2024-01-25 MED ORDER — GABAPENTIN 300 MG PO CAPS: 300.0000 mg | ORAL_CAPSULE | Freq: Three times a day (TID) | ORAL | Status: DC

## 2024-01-25 MED ORDER — GABAPENTIN 100 MG PO CAPS
200.0000 mg | ORAL_CAPSULE | Freq: Three times a day (TID) | ORAL | Status: DC
  Administered 2024-01-25 – 2024-01-26 (×3): 200 mg via ORAL
  Filled 2024-01-25 (×3): qty 2

## 2024-01-25 MED ORDER — LORAZEPAM 2 MG/ML IJ SOLN
1.0000 mg | INTRAMUSCULAR | Status: AC | PRN
Start: 2024-01-25 — End: 2024-01-28

## 2024-01-25 MED ORDER — TIZANIDINE HCL 4 MG PO TABS
4.0000 mg | ORAL_TABLET | Freq: Every evening | ORAL | Status: DC | PRN
  Administered 2024-01-25 – 2024-01-26 (×2): 4 mg via ORAL
  Filled 2024-01-25 (×2): qty 1

## 2024-01-25 MED ORDER — CEFTRIAXONE SODIUM 2 G IJ SOLR
2.0000 g | INTRAMUSCULAR | Status: AC
  Administered 2024-01-25 – 2024-01-28 (×4): 2 g via INTRAVENOUS
  Filled 2024-01-25 (×4): qty 20

## 2024-01-25 MED ORDER — THIAMINE MONONITRATE 100 MG PO TABS
100.0000 mg | ORAL_TABLET | Freq: Every day | ORAL | Status: DC
  Administered 2024-01-25 – 2024-01-28 (×4): 100 mg via ORAL
  Filled 2024-01-25 (×4): qty 1

## 2024-01-25 NOTE — Progress Notes (Signed)
 NAME:  Dustin Park, MRN:  [id number], DOB:  Jan 10, 1946, LOS: 1 ADMISSION DATE:  01/24/2024, CONSULTATION DATE:  4/21 REFERRING MD:  Dr. 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 Also has an alcohol history. 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 Wife reports frailty and failure to thrive over about the past year. Barely gets out of the recliner. Today he was in his usual state of health until his wife found him at the bottom of a three step flight of stairs in urine. She had seen him minutes prior and he was fine. He was transported via EMS to [facility] ED. He was poorly responsive upon arrival to the ED but did improve some. Laboratory evaluation was concerning NA 115 and WBC 17.8. PCCM was called to evaluate in the setting of severe hyponatremia.   Pertinent  Medical History   has a past medical history of Hypertension.   Significant Hospital Events: Including procedures, antibiotic start and stop dates in addition to other pertinent events   4/21 admit to [facility] for hyponatremia  Interim History / Subjective:   No new events overnight. Afebrile Oxygen increased this morning to 8 L Offers no complaint  Objective   Blood pressure 123/61, pulse 71, temperature 97.7 F (36.5 C), temperature source Oral, resp. rate 19, weight 55.6 kg, SpO2 99%.        Intake/Output Summary (Last 24 hours) at 01/25/2024 0908 Last data filed at 01/25/2024 0600 Gross per 24 hour  Intake 1062.04 ml  Output 450 ml  Net 612.04 ml   Filed Weights   01/25/24 0500  Weight: 55.6 kg    Examination: General: Frail elderly appearing male , lying in bed, no distress HENT: [place]/AT, kyphosis, no JVD Lungs: No accessory muscle use, clear breath sounds bilateral Cardiovascular: RRR, no MRG Abdomen: Soft, non-tender,  non-distended Extremities: No acute deformity. No edema Neuro: Lethargic but alert, oriented to place and person, non-focal. Moving all extremities with good strength.    Labs show improving sodium to 119, mild hypokalemia 3.7  Resolved Hospital Problem list     Assessment & Plan:   Hyponatremia: likely hypovolemic, chronic, improving - Continue normal saline - Trend sodium every 6 hours  Possible seizure - Correct sodium - Observe for signs of alcohol withdrawal, drinks a bottle of wine every day - DC Valium  - CIWA ativan  per protocol  Trauma fall with known spine compression fractures - Pan scan neg  - PRN fentanyl   Hypertension: - holding home medications for now - PRN labetalol  Acute hypoxic respiratory failure Right lower lobe pneumonia, possible aspiration - Ceftriaxone  for 5 days - Dial down oxygen   Okay to transfer to medical telemetry and to [facility] 4/23  Best Practice (right click and "Reselect all SmartList Selections" daily)   Diet/type: NPO -swallow evaluation and advance DVT prophylaxis prophylactic heparin   Pressure ulcer(s): pressure ulcer assessment deferred  GI prophylaxis: H2B Lines: N/A Foley:  N/A Code Status:  full code Last date of multidisciplinary goals of care discussion [email or web address]  ]  Labs   CBC: Recent Labs  Lab 01/24/24 1617  WBC 17.8*  NEUTROABS 16.2*  HGB 14.6  HCT 39.3  MCV 94.2  PLT 192    Basic Metabolic Panel: Recent Labs  Lab 01/24/24 1617 01/24/24 1923 01/24/24 2338 01/25/24 0605  NA  115* 116* 118* 119*  K 4.1 3.9 3.9 3.7  CL 73* 71* 73* 78*  CO2 29 34* 34* 33*  GLUCOSE 152* 121* 101* 86  BUN 8 7* 8 7*  CREATININE 0.49* 0.58* 0.45* 0.41*  CALCIUM 8.6* 8.5* 8.4* 8.3*   GFR: Estimated Creatinine Clearance: 62.7 mL/min (A) (by C-G formula based on SCr of 0.41 mg/dL (L)). Recent Labs  Lab 01/24/24 1617  WBC 17.8*    Liver Function Tests: Recent Labs  Lab 01/24/24 1617  AST 36  ALT 16  ALKPHOS 114   BILITOT 1.2  PROT 6.6  ALBUMIN 3.2*   No results for input(s): "LIPASE", "AMYLASE" in the last 168 hours. No results for input(s): "AMMONIA" in the last 168 hours.  ABG No results found for: "PHART", "PCO2ART", "PO2ART", "HCO3", "TCO2", "ACIDBASEDEF", "O2SAT"   Coagulation Profile: No results for input(s): "INR", "PROTIME" in the last 168 hours.  Cardiac Enzymes: No results for input(s): "CKTOTAL", "CKMB", "CKMBINDEX", "TROPONINI" in the last 168 hours.  HbA1C: No results found for: "HGBA1C"  CBG: Recent Labs  Lab 01/24/24 2342 01/25/24 0342 01/25/24 0752  GLUCAP 107* 94 98   Nimah Uphoff V. Villa Greaser MD [facility]  See Amion for personal pager PCCM on call pager [phone] until 7pm. Please call Elink 7p-7a. [phone]  01/25/2024 9:08 AM

## 2024-01-25 NOTE — TOC CAGE-AID Note (Signed)
 Transition of Care [facility]) - CAGE-AID Screening   Patient Details  Name: Dustin Park MRN: [id number] Date of Birth: [birth year]  Transition of Care [facility]) CM/SW Contact:    Joie Hipps E Indica Marcott, LCSW Phone Number: 01/25/2024, 8:16 AM   Clinical Narrative:    CAGE-AID Screening: Substance Abuse Screening unable to be completed due to: : Patient unable to participate

## 2024-01-25 NOTE — Evaluation (Signed)
 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 Wife reports frailty and failure to thrive over about the past year.  wife found him at the bottom of a three step flight of stairs in urine. He was poorly responsive upon arrival to the ED. Laboratory evaluation was concerning NA 115 and WBC 17.8. PCCM was called to evaluate in the setting of severe hyponatremia. Chest imaging revealed: Right lower lung zone airspace and interstitial opacities consistent with infection/inflammation. CT of head revealed no acute abnormalities.    Assessment / Plan / Recommendation  Clinical Impression  Patient presents with clinical oropharyngeal swallowing function that would benefit from further ST dynamic assessment. SLP conducted bedside swallow evaluation including oral mechanism exam which was positive for generalized weakness, weak cough, and low vocal intensity. SLP assisted with oral care via suction toothbrush, then administered thin liquids, puree, and regular solids. Patient endorses no difficulty eating/drinking at home other than significantly reduced appetite d/t back pain. SLP noted intermittent/infrequent weak cough prior to and during PO administration, though did not seem to worsen during swallowing tasks. Patient tolerated small and large sips from straw of water with no overt s/sx of  penetration/aspiration. Likewise, puree was tolerated without difficulty. During regular solids, patient endorsed his lower dentures were at home thus he would likely have trouble chewing. Patient exhibited prolonged mastication/oral preparation. With all textures, demonstrated frequent eructations, which could suggest esophageal difficulty, though no globus sensation endorsed by patient. Recommend initiation of Dys2/thin liquid diet, however given current pneumonia and intermittent cough throughout session, may benefit from MBS to rule out any covert penetration/aspiration. Patient has severe back pain which prevents fully upright positioning, which may impede completion of any potential MBS. Will continue to monitor. Administer medications whole (or cut in half for large pills) in puree. SLP Visit Diagnosis: Dysphagia, unspecified (R13.10)    Aspiration Risk  Mild aspiration risk    Diet Recommendation Dysphagia 2 (Fine chop);Thin liquid    Liquid Administration via: Straw;Cup Medication Administration: Whole meds with puree Supervision: Staff to assist with self feeding Compensations: Slow rate;Small sips/bites Postural Changes: Seated upright at 90 degrees    Other  Recommendations Oral Care Recommendations: Oral care BID    Recommendations for follow up therapy are one component of a multi-disciplinary discharge planning process, led by the attending physician.  Recommendations may be updated based on patient status, additional functional criteria and insurance authorization.  Follow up Recommendations Follow physician's recommendations for discharge plan and follow up therapies      Assistance Recommended at Discharge    Functional Status Assessment Patient has had a recent decline in their functional status and demonstrates the ability to make significant improvements in function in a reasonable and predictable amount of time.  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 Wife reports frailty and failure to thrive over about the past year.  wife found him at the bottom of a three step flight of stairs in urine. He was poorly responsive upon arrival to the ED. Laboratory evaluation was concerning NA 115 and WBC 17.8. PCCM was called to evaluate in the setting of severe hyponatremia. Chest imaging revealed: Right lower lung zone airspace and interstitial opacities consistent with infection/inflammation. CT of head revealed no acute abnormalities. Type of Study: Bedside Swallow Evaluation Previous Swallow Assessment: n/a Diet Prior to this Study: Information not available (None entered into chart) Temperature Spikes Noted: No Respiratory Status: Nasal cannula History of Recent Intubation: No Behavior/Cognition: Cooperative;Pleasant mood;Lethargic/Drowsy;Requires cueing Oral Cavity Assessment: Dry;Erythema Oral Care Completed by SLP: Yes Oral Cavity - Dentition: Poor condition;Missing dentition Vision: Functional for self-feeding Self-Feeding Abilities: Able to feed self;Needs set up Patient Positioning:  (Semi-upright in bed) Baseline Vocal Quality: Hoarse;Low vocal intensity Volitional Cough: Weak Volitional Swallow: Able to elicit    Oral/Motor/Sensory Function Overall Oral Motor/Sensory Function: Generalized oral weakness Facial ROM: Reduced right;Reduced left Facial Symmetry: Within Functional Limits Facial Strength: Reduced right;Reduced left Lingual ROM: Reduced right;Reduced left Lingual Symmetry: Within Functional Limits Lingual Strength: Reduced Lingual Sensation: Within Functional Limits Velum: Within Functional Limits Mandible: Impaired   Ice  Chips Ice chips: Not tested   Thin Liquid Thin Liquid: Within functional limits Presentation: Self Fed;Straw    Nectar Thick Nectar Thick Liquid: Not tested   Honey Thick Honey Thick Liquid: Not tested   Puree Puree: Within functional limits Presentation: Spoon;Self Fed   Solid     Solid: Impaired Presentation: Self Fed Oral Phase Impairments: Impaired mastication Oral Phase Functional Implications: Prolonged oral transit;Impaired mastication;Oral residue     Dorla Gartner, M.A., CCC-SLP  Quintessa Simmerman A Ori Kreiter 01/25/2024,11:36 AM

## 2024-01-26 DIAGNOSIS — J9601 Acute respiratory failure with hypoxia: Secondary | ICD-10-CM | POA: Diagnosis present

## 2024-01-26 DIAGNOSIS — J690 Pneumonitis due to inhalation of food and vomit: Secondary | ICD-10-CM | POA: Diagnosis present

## 2024-01-26 DIAGNOSIS — L8990 Pressure ulcer of unspecified site, unspecified stage: Secondary | ICD-10-CM | POA: Diagnosis present

## 2024-01-26 DIAGNOSIS — E871 Hypo-osmolality and hyponatremia: Secondary | ICD-10-CM | POA: Diagnosis not present

## 2024-01-26 LAB — BASIC METABOLIC PANEL WITH GFR
Anion gap: 9 (ref 5–15)
Anion gap: 15 (ref 5–15)
BUN: <5 mg/dL — ABNORMAL LOW (ref 8–23)
BUN: <5 mg/dL — ABNORMAL LOW (ref 8–23)
CO2: 35 mmol/L — ABNORMAL HIGH (ref 22–32)
CO2: 32 mmol/L (ref 22–32)
Calcium: 8.5 mg/dL — ABNORMAL LOW (ref 8.9–10.3)
Calcium: 8.7 mg/dL — ABNORMAL LOW (ref 8.9–10.3)
Chloride: 83 mmol/L — ABNORMAL LOW (ref 98–111)
Chloride: 81 mmol/L — ABNORMAL LOW (ref 98–111)
Creatinine, Ser: 0.38 mg/dL — ABNORMAL LOW (ref 0.61–1.24)
Creatinine, Ser: 0.34 mg/dL — ABNORMAL LOW (ref 0.61–1.24)
GFR, Estimated: >60 mL/min (ref >60)
GFR, Estimated: >60 mL/min (ref >60)
Glucose, Bld: 132 mg/dL — ABNORMAL HIGH (ref 70–99)
Glucose, Bld: 101 mg/dL — ABNORMAL HIGH (ref 70–99)
Potassium: 4.1 mmol/L (ref 3.5–5.1)
Potassium: 4.4 mmol/L (ref 3.5–5.1)
Sodium: 127 mmol/L — ABNORMAL LOW (ref 135–145)
Sodium: 128 mmol/L — ABNORMAL LOW (ref 135–145)

## 2024-01-26 LAB — MAGNESIUM: Magnesium: 1.7 mg/dL (ref 1.7–2.4)

## 2024-01-26 LAB — CBC WITH DIFFERENTIAL/PLATELET
Abs Immature Granulocytes: 0.12 10*3/uL — ABNORMAL HIGH (ref 0.00–0.07)
Basophils Absolute: 0 10*3/uL (ref 0.0–0.1)
Basophils Relative: 0 %
Eosinophils Absolute: 0 10*3/uL (ref 0.0–0.5)
Eosinophils Relative: 0 %
HCT: 41.2 % (ref 39.0–52.0)
Hemoglobin: 14.6 g/dL (ref 13.0–17.0)
Immature Granulocytes: 1 %
Lymphocytes Relative: 4 %
Lymphs Abs: 0.6 10*3/uL — ABNORMAL LOW (ref 0.7–4.0)
MCH: 34.8 pg — ABNORMAL HIGH (ref 26.0–34.0)
MCHC: 35.4 g/dL (ref 30.0–36.0)
MCV: 98.1 fL (ref 80.0–100.0)
Monocytes Absolute: 1.4 10*3/uL — ABNORMAL HIGH (ref 0.1–1.0)
Monocytes Relative: 8 %
Neutro Abs: 14.2 10*3/uL — ABNORMAL HIGH (ref 1.7–7.7)
Neutrophils Relative %: 87 %
Platelets: 197 10*3/uL (ref 150–400)
RBC: 4.2 MIL/uL — ABNORMAL LOW (ref 4.22–5.81)
RDW: 11.6 % (ref 11.5–15.5)
WBC: 16.3 10*3/uL — ABNORMAL HIGH (ref 4.0–10.5)
nRBC: 0 % (ref 0.0–0.2)

## 2024-01-26 LAB — PHOSPHORUS: Phosphorus: 2.3 mg/dL — ABNORMAL LOW (ref 2.5–4.6)

## 2024-01-26 MED ORDER — AMLODIPINE BESYLATE 5 MG PO TABS
5.0000 mg | ORAL_TABLET | Freq: Every day | ORAL | Status: DC
  Administered 2024-01-26 – 2024-01-28 (×3): 5 mg via ORAL
  Filled 2024-01-26 (×3): qty 1

## 2024-01-26 MED ORDER — ACETAMINOPHEN 650 MG RE SUPP: 650.0000 mg | Freq: Four times a day (QID) | RECTAL | Status: DC | PRN

## 2024-01-26 MED ORDER — K PHOS MONO-SOD PHOS DI & MONO 155-852-130 MG PO TABS
250.0000 mg | ORAL_TABLET | Freq: Three times a day (TID) | ORAL | Status: AC
  Administered 2024-01-26 (×3): 250 mg via ORAL
  Filled 2024-01-26 (×3): qty 1

## 2024-01-26 MED ORDER — HYDROCODONE-ACETAMINOPHEN 5-325 MG PO TABS
1.0000 | ORAL_TABLET | Freq: Four times a day (QID) | ORAL | Status: DC | PRN
  Administered 2024-01-26 – 2024-01-28 (×7): 1 via ORAL
  Filled 2024-01-26 (×8): qty 1

## 2024-01-26 MED ORDER — ACETAMINOPHEN 325 MG PO TABS
650.0000 mg | ORAL_TABLET | Freq: Four times a day (QID) | ORAL | Status: DC | PRN
  Administered 2024-01-26 (×2): 650 mg via ORAL
  Filled 2024-01-26: qty 2

## 2024-01-26 MED ORDER — GABAPENTIN 300 MG PO CAPS
300.0000 mg | ORAL_CAPSULE | Freq: Three times a day (TID) | ORAL | Status: DC
  Administered 2024-01-26 – 2024-01-28 (×7): 300 mg via ORAL
  Filled 2024-01-26 (×7): qty 1

## 2024-01-26 MED ORDER — IPRATROPIUM-ALBUTEROL 0.5-2.5 (3) MG/3ML IN SOLN: 3.0000 mL | RESPIRATORY_TRACT | Status: DC | PRN

## 2024-01-26 NOTE — Plan of Care (Signed)

## 2024-01-26 NOTE — Hospital Course (Signed)
 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  Family had seen him minutes prior and he was fine.  In the ED he was poorly responsive.  Labs showed a sodium of 115 with WBC elevated at 17.8.  Patient was initially admitted to the ICU for severe hyponatremia.  Hyponatremia: Thought to be hypovolemic.  Received normal saline.  Improving sodium levels.  Latest sodium of 127.  Will continue to monitor.  Possible seizure Concerns for hyponatremia versus alcohol withdrawal.  Drinks a bottle of wine every day.  On CIWA protocol  Trauma status post fall with known spine compression fractures CT scan of the lumbar spine abdomen and pelvis angiogram of the chest cervical spine and head CT were all negative for fractures.  Continue pain management.  Will get PT OT evaluation.   Hypertension: On as needed labetalol.  Patient is on amlodipine  benazepril Coreg  at home.  Will also get orthostatic vitals when able.   Acute hypoxic respiratory failure secondary to right lower lobe pneumonia, possible aspiration Still with WBC elevated at 16.3.  Afebrile.  Currently on 5 L oxygen via nasal cannula.  Will continue to wean as able.  Continue Rocephin  for 5 days.  Pressure injury sacral stage II present on admission.  Will continue with wound care. Pressure Injury 01/24/24 Sacrum Mid Stage 2 -  Partial thickness loss of dermis presenting as a shallow open injury with a red, pink wound bed without slough. (Active)  01/24/24 2020  Location: Sacrum  Location Orientation: Mid  Staging: Stage 2 -  Partial thickness loss of dermis presenting as a shallow open injury with a red, pink wound bed without slough.  Wound Description (Comments):    Present on Admission: Yes

## 2024-01-26 NOTE — Progress Notes (Signed)
 Physical Therapy Note  PT eval complete with full note to follow;  Recommend going home with HHPT follow up for transition out of hospital;  Rec 3in1, and Will consider RW vs rollator vs cane over the next few sessions;   Darcus Eastern, [place]  Acute Rehabilitation Services Pager [phone] Office [phone]

## 2024-01-26 NOTE — Progress Notes (Signed)
 Speech Language Pathology Treatment: Dysphagia  Patient Details Name: Dustin Park MRN: [id number] DOB: 01-18-1946 Today's Date: 01/26/2024 Time: 4098-1191 SLP Time Calculation (min) (ACUTE ONLY): 8 min  Assessment / Plan / Recommendation Clinical Impression  SLP conducted skilled therapy session targeting dysphagia goals. Patient endorses swallowing has improved since yesterday's session and notes that he had no difficulty with breakfast though laments that textures arrived minced. Despite disappointment in food textures, patient endorses agreement that until wife brings dentures from home, they are the most appropriate textures. SLP trialed thin liquids and regular solids (patient refused puree trials). Patient continues to exhibit prolonged mastication/oral preparation with regular solids. No overt s/sx of penetration/aspiration with any administered textures. Recommend continuation of Dys2/thin liquid diet. Given improved mentation from previous, administer medications whole with thin liquids. SLP will plan to perform upgraded texture trials upon patient's receipt of dentures. Patient in agreement. Patient was left in room with call bell in reach and alarm set. SLP will continue to target goals per plan of care.     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 Wife reports frailty and failure to thrive over about the past year.  wife found him at the bottom of a three step flight of stairs in urine. He was poorly responsive upon arrival to the ED. Laboratory evaluation was concerning NA 115 and WBC 17.8. PCCM was called to evaluate in the setting of severe hyponatremia. Chest imaging revealed: Right lower lung zone airspace and interstitial opacities consistent with infection/inflammation. CT of head revealed no acute abnormalities.      SLP Plan   Continue with current plan of care      Recommendations for follow up therapy are one component of a multi-disciplinary discharge planning process, led by the attending physician.  Recommendations may be updated based on patient status, additional functional criteria and insurance authorization.    Recommendations  Diet recommendations: Dysphagia 2 (fine chop);Thin liquid Liquids provided via: Cup;Straw Medication Administration: Whole meds with liquid Supervision: Patient able to self feed Compensations: Slow rate;Small sips/bites                  Oral care BID   PRN Dysphagia, unspecified (R13.10)     Continue with current plan of care    Dorla Gartner, M.A., CCC-SLP  Dustin Park  01/26/2024, 12:51 PM

## 2024-01-26 NOTE — Evaluation (Signed)
 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 Also has an alcohol history.  Clinical Impression  Patient admitted with above diagnosis; he comes from home, where he lives with his wife and a multilevel home; patient intends to spend his time in the garage, where he has a living room type set up, and he sleeps in the recliner in his garage; he has three steps to access the rest of the house, in particular his bathroom; prior to admission, he walked household distances without an assistive device; He has also been experiencing what sounds like FTT coming into this admission;   Pt presents to PT  with decreased activity tolerance, back pain (chronic with what seems to be a component of acute back pain), profound kyphotic posture, and decreased postural strength, especially with upper trunk and cervical extension, decreased standing tolerance, decreased tolerance of upright activity, and functional dependencies; Today, Dustin Park needed minimal assistance to get up to sit at edge of bed, and minimal assistance for a basic squat pivot transfer bed to recliner; Too fatigued to initaite amb today;  Took extra time to fashion bolsters for each side of his spine with the hopes of being able to redistribute pressure at his back and spine and increase sitting tolerance;   Patient and his wife are skeptical that a rolling walker will fit in their home; they are hoping he can discharge home with home health physical therapy follow up; Will benefit from acute PT to maximize independence and safety with mobility and ADLs and facilitate getting home safely;    For home, recommend HHPT/OT follow up; [facility] Speech  Therapy if indicated by SLP;   Also recommend HHSW follow up for support with potential ETOH use disorder      If plan is discharge home, recommend the following: A lot of help with walking and/or transfers;A lot of help with bathing/dressing/bathroom   Can travel by private vehicle        Equipment Recommendations Rolling walker (2 wheels);Rollator (4 wheels);BSC/3in1;Wheelchair (measurements PT) (Will consider RW and/or rollator; pt and wife don't think RW/WC sill fit in theiur fome); consider transport  Recommendations for Other Services  OT consult (ordered per protocol)    Functional Status Assessment Patient has had a recent decline in their functional status and demonstrates the ability to make significant improvements in function in a reasonable and predictable amount of time.     Precautions / Restrictions Precautions Precautions: Fall Recall of Precautions/Restrictions: Intact Restrictions Weight Bearing Restrictions Per Provider Order: No      Mobility  Bed Mobility Overal bed mobility: Needs Assistance Bed Mobility: Supine to Sit     Supine to sit: Min assist     General bed mobility comments: Got up to sitting  EOB using bedrails overall well; min assist to square off hips at EOB using bed pad    Transfers Overall transfer level: Needs assistance Equipment used: 1 person hand held assist Transfers: Bed to chair/wheelchair/BSC       Squat pivot transfers: Min assist     General transfer comment: Performed squat pivot transfer to recliner on pt's R side; seemed too fatigued to stand upright, or take steps today    Ambulation/Gait  Stairs            Wheelchair Mobility     Tilt Bed    Modified Rankin (Stroke Patients Only)       Balance                                             Pertinent Vitals/Pain Pain Assessment Pain Assessment: Faces Faces Pain Scale: Hurts even more Pain  Location: back pain Pain Descriptors / Indicators: Grimacing, Guarding, Aching Pain Intervention(s): Monitored during session, Patient requesting pain meds-RN notified, Other (comment) (worked on bolstering for sitting tolerance)    Home Living Family/patient expects to be discharged to:: Private residence Living Arrangements: Spouse/significant other Available Help at Discharge: Family Type of Home: House Home Access: Stairs to enter   Secretary/administrator of Steps: 1 (into garage) Alternate Level Stairs-Number of Steps: 3 (3 steps (for bathroom access) from garage into rest of hous) Home Layout: Multi-level;Other (Comment) (Pt spends most of time in the garage, where there is a den-like setup) Home Equipment: None Additional Comments: Pt spends most of his time in the garage (where there is a den-style setup; sleeps in recliner in garage; 3 steps to rest of house to access bathroom in particular; Pt and wife do not think a RW or wheelchair will fit or work in their home; Need more info related to bath/shower setup    Prior Function Prior Level of Function : Independent/Modified Independent             Mobility Comments: walks in house without assistive device; typically th e 3 steps into the rest of the house are not an issue for pt ADLs Comments: Independent     Extremity/Trunk Assessment   Upper Extremity Assessment Upper Extremity Assessment: Defer to OT evaluation    Lower Extremity Assessment Lower Extremity Assessment: Generalized weakness    Cervical / Trunk Assessment Cervical / Trunk Assessment: Kyphotic;Other exceptions Cervical / Trunk Exceptions: significant kyhposis coupled with trunk extensor weakness; tends ot sink into cervical spine flexion in sitting; Wife, Dustin Park states he often falls asleep neck in completely flexed position  Communication   Communication Communication: No apparent difficulties    Cognition Arousal: Alert Behavior During Therapy:  WFL for tasks assessed/performed   PT - Cognitive impairments: No apparent impairments                       PT - Cognition Comments: no overt cognitive impariments, but would benefit from deepr investigation Following commands: Intact       Cueing Cueing Techniques: Verbal cues, Tactile cues     General Comments General comments (skin integrity, edema, etc.): session conducted on supplemental O2 3.5 to 4 L; Difficulty with c-spine extension unless pt is reclined, which puts a considerable amount of pressure on t-spine; took extra time to make and use bolsters placed on each side of spine to lessen the pressure on pt's thoracic spinous processes    Exercises     Assessment/Plan    PT Assessment Patient needs continued PT services  PT Problem List Decreased strength;Decreased range of motion;Decreased activity tolerance;Decreased balance;Decreased mobility;Decreased coordination;Decreased cognition;Decreased knowledge of use of DME;Decreased safety awareness;Decreased knowledge of precautions;Pain;Impaired tone       PT Treatment Interventions DME instruction;Gait training;Functional mobility training;Stair training;Therapeutic activities;Therapeutic exercise;Balance training;Neuromuscular re-education;Cognitive remediation;Patient/family education;Wheelchair mobility training;Manual techniques  PT Goals (Current goals can be found in the Care Plan section)  Acute Rehab PT Goals Patient Stated Goal: get home PT Goal Formulation: With patient/family Time For Goal Achievement: 02/09/24 Potential to Achieve Goals: Fair    Frequency Min 2X/week     Co-evaluation               AM-PAC PT "6 Clicks" Mobility  Outcome Measure Help needed turning from your back to your side while in a flat bed without using bedrails?: A Little Help needed moving from lying on your back to sitting on the side of a flat bed without using bedrails?: A Little Help needed moving to and  from a bed to a chair (including a wheelchair)?: A Little Help needed standing up from a chair using your arms (e.g., wheelchair or bedside chair)?: A Little Help needed to walk in hospital room?: A Lot Help needed climbing 3-5 steps with a railing? : Total 6 Click Score: 15    End of Session   Activity Tolerance: Patient tolerated treatment well Patient left: in chair;with call bell/phone within reach;with family/visitor present;with chair alarm set Nurse Communication: Mobility status PT Visit Diagnosis: Unsteadiness on feet (R26.81);Other abnormalities of gait and mobility (R26.89);History of falling (Z91.81);Muscle weakness (generalized) (M62.81);Adult, failure to thrive (R62.7)    Time: 1219-1250 PT Time Calculation (min) (ACUTE ONLY): 31 min   Charges:   PT Evaluation $PT Eval Moderate Complexity: 1 Mod PT Treatments $Therapeutic Activity: 8-22 mins PT General Charges $$ ACUTE PT VISIT: 1 Visit         Darcus Eastern, PT  Acute Rehabilitation Services Office [phone] Secure Chat welcomed   Marcial Setting 01/26/2024, 5:06 PM

## 2024-01-26 NOTE — Plan of Care (Signed)
  Problem: Skin Integrity: Goal: Risk for impaired skin integrity will decrease Outcome: Not Progressing   Problem: Safety: Goal: Ability to remain free from injury will improve Outcome: Not Progressing   Problem: Pain Managment: Goal: General experience of comfort will improve and/or be controlled Outcome: Not Progressing

## 2024-01-26 NOTE — Progress Notes (Addendum)
 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  Family had seen him minutes prior and he was fine.  In the ED he was poorly responsive.  Labs showed a sodium of 115 with WBC elevated at 17.8.  Patient was initially admitted to the ICU for severe hyponatremia.  Currently transferred out of the ICU service to [facility].    Assessment/Plan: Principal Problem:   Hyponatremia syndrome Active Problems:   Pressure injury of skin   Acute hypoxic respiratory failure (HCC)   Aspiration pneumonia (HCC)  Hyponatremia: Thought to be hypovolemic.  Received normal saline.  Improving sodium levels.  Latest sodium of 127.  Will continue to monitor.  Buccini.  Possible seizure Concerns for hyponatremia versus alcohol withdrawal.  Drinks a bottle of wine every day.  On CIWA protocol.  No further episodes reported.  Aspiration precautions.  Trauma status post fall with known spine compression fractures. CT scan of the lumbar spine abdomen and pelvis angiogram of the chest cervical spine and head CT were all negative for fractures.  Continue pain management.  Will get PT OT evaluation.   Hypertension:  Patient is on amlodipine  -benazepril Coreg  at home.  Will also get orthostatic vitals when able.  Will start with amlodipine  and continue to hold other medications.   Acute hypoxic respiratory failure secondary to right lower lobe pneumonia, possible aspiration Still with WBC elevated at 16.3.  Afebrile.  Currently on 5 L oxygen via nasal cannula.  Will continue to wean as able.  Continue Rocephin  for 5  days.  Patient has been seen by speech therapy and on dysphagia 2 diet at this time.  Continue aspiration precautions.  Will add DuoNebs, incentive spirometry and flutter valv  Hypophosphatemia.  Will replace with K-Phos.  Check levels in AM.  Pressure injury sacral stage II present on admission.  Will continue with wound care. Pressure Injury 01/24/24 Sacrum Mid Stage 2 -  Partial thickness loss of dermis presenting as a shallow open injury with a red, pink wound bed without slough. (Active)  01/24/24 2020  Location: Sacrum  Location Orientation: Mid  Staging: Stage 2 -  Partial thickness loss of dermis presenting as a shallow open injury with a red, pink wound bed without slough.  Wound Description (Comments):   Present on Admission: Yes     DVT prophylaxis: heparin  injection 5,000 Units Start: 01/24/24 2200 SCDs Start: 01/24/24 1835   Disposition: Uncertain at this time.  Will get PT OT evaluation.  Status is: Inpatient  Remains inpatient appropriate because: Hyponatremia, hypoxic respiratory failure requiring 5L [place] oxygen, pending PT OT evaluation, pending clinical improvement    Code Status:     Code Status: Full Code  Family Communication: None at bedside.  I tried to reach out to the patient's granddaughter and the spouse both but was unable to reach them despite multiple attempts  Consultants: PCCM  Procedures: None  Anti-infectives:  Rocephin  IV  Anti-infectives (From admission, onward)    Start     Dose/Rate Route Frequency Ordered Stop   01/25/24 1915  cefTRIAXone  (ROCEPHIN ) 2 g in sodium chloride   0.9 % 100 mL IVPB        2 g 200 mL/hr over 30 Minutes Intravenous Every 24 hours 01/25/24 0849 01/29/24 1759   01/24/24 1915  cefTRIAXone  (ROCEPHIN ) 1 g in sodium chloride  0.9 % 100 mL IVPB  Status:  Discontinued        1 g 200 mL/hr over 30 Minutes Intravenous Every 24 hours 01/24/24 1902 01/25/24 0849       Subjective: Today, patient was seen and examined  at bedside.  Patient with complaints of cough with some shortness of breath but denies overt dyspnea.  No nausea vomiting fever chills or rigor.  Has not had a bowel movement.  Objective: Vitals:   01/26/24 0455 01/26/24 0759  BP: 129/77 125/70  Pulse: 75 71  Resp: 16 17  Temp: 98.3 F (36.8 C) 97.6 F (36.4 C)  SpO2: 100% 99%    Intake/Output Summary (Last 24 hours) at 01/26/2024 1035 Last data filed at 01/26/2024 0914 Gross per 24 hour  Intake 1469.85 ml  Output 1850 ml  Net -380.15 ml   Filed Weights   01/25/24 0500 01/26/24 0500  Weight: 55.6 kg 57.6 kg   Body mass index is 17.71 kg/m.   Physical Exam:  GENERAL: Patient is alert awake and oriented. Not in obvious distress.  On nasal cannula, Communicative, thinly built, elderly male, HENT: No scleral pallor or icterus. Pupils equally reactive to light. Oral mucosa is moist NECK: is supple, no gross swelling noted. CHEST:   Diminished breath sounds bilaterally.  Coarse breath sounds noted. CVS: S1 and S2 heard, no murmur. Regular rate and rhythm.  ABDOMEN: Soft, non-tender, bowel sounds are present. EXTREMITIES: No edema. CNS: Cranial nerves are intact. No focal motor deficits. SKIN: warm and dry without rashes.  Data Review: I have personally reviewed the following laboratory data and studies,  CBC: Recent Labs  Lab 01/24/24 1617 01/26/24 0717  WBC 17.8* 16.3*  NEUTROABS 16.2* 14.2*  HGB 14.6 14.6  HCT 39.3 41.2  MCV 94.2 98.1  PLT 192 197   Basic Metabolic Panel: Recent Labs  Lab 01/25/24 0750 01/25/24 1238 01/25/24 1821 01/26/24 0015 01/26/24 0717  NA 121* 123* 125* 127* 128*  K 3.8 4.1 4.4 4.1 4.4  CL 77* 77* 80* 83* 81*  CO2 35* 35* 34* 35* 32  GLUCOSE 83 84 102* 132* 101*  BUN 6* <5* <5* <5* <5*  CREATININE 0.48* 0.47* 0.44* 0.38* 0.34*  CALCIUM 8.5* 8.9 8.5* 8.5* 8.7*  MG  --   --   --   --  1.7  PHOS  --   --   --   --  2.3*   Liver Function Tests: Recent Labs  Lab 01/24/24 1617   AST 36  ALT 16  ALKPHOS 114  BILITOT 1.2  PROT 6.6  ALBUMIN 3.2*   No results for input(s): "LIPASE", "AMYLASE" in the last 168 hours. No results for input(s): "AMMONIA" in the last 168 hours. Cardiac Enzymes: No results for input(s): "CKTOTAL", "CKMB", "CKMBINDEX", "TROPONINI" in the last 168 hours. BNP (last 3 results) No results for input(s): "BNP" in the last 8760 hours.  ProBNP (last 3 results) No results for input(s): "PROBNP" in the last 8760 hours.  CBG: Recent Labs  Lab 01/25/24 0342 01/25/24 0752 01/25/24 1121 01/25/24 1524 01/25/24 1950  GLUCAP 94 98 73 89 134*   Recent Results (from the past 240 hours)  MRSA Next Gen by PCR, Nasal     Status: None  Collection Time: 01/24/24  7:57 PM   Specimen: Nasal Mucosa; Nasal Swab  Result Value Ref Range Status   MRSA by PCR Next Gen NOT DETECTED NOT DETECTED Final    Comment: (NOTE) The GeneXpert MRSA Assay (FDA approved for NASAL specimens only), is one component of a comprehensive MRSA colonization surveillance program. It is not intended to diagnose MRSA infection nor to guide or monitor treatment for MRSA infections. Test performance is not FDA approved in patients less than 71 years old. Performed at [facility], [address] [place], [place] [address]      Studies: CT Angio Chest PE W and/or Wo Contrast Result Date: 01/24/2024 CLINICAL DATA:  Abdominal trauma, blunt; Pulmonary embolism (PE) suspected, high prob. Trauma blunt. obtunded. Chest pain. EXAM: CT ANGIOGRAPHY CHEST CT ABDOMEN AND PELVIS WITH CONTRAST TECHNIQUE: Multidetector CT imaging of the chest was performed using the standard protocol during bolus administration of intravenous contrast. Multiplanar CT image reconstructions and MIPs were obtained to evaluate the vascular anatomy. Multidetector CT imaging of the abdomen and pelvis was performed using the standard protocol during bolus administration of intravenous contrast. RADIATION DOSE  REDUCTION: This exam was performed according to the departmental dose-optimization program which includes automated exposure control, adjustment of the mA and/or kV according to patient size and/or use of iterative reconstruction technique. CONTRAST:  75mL OMNIPAQUE  IOHEXOL  350 MG/ML SOLN COMPARISON:  None Available. FINDINGS: CTA CHEST FINDINGS Cardiovascular: Satisfactory opacification of the pulmonary arteries to the segmental level. Limited evaluation of subsegmental level due to motion artifact. No evidence of pulmonary embolism. Normal heart size. No pericardial effusion. Four-vessel coronary calcification. Severe atherosclerotic plaque. Mediastinum/Nodes: No pneumomediastinum. No mediastinal hematoma. The esophagus is unremarkable. The thyroid is unremarkable. The central airways are patent. No mediastinal, hilar, or axillary lymphadenopathy. Lungs/Pleura: Centrilobular emphysematous changes. Right lower lobe peribronchovascular ground-glass and consolidative airspace opacities. No pulmonary nodule. No pulmonary mass. No pulmonary contusion or laceration. No pneumatocele formation. No pleural effusion. No pneumothorax. No hemothorax. Musculoskeletal/Chest wall: No chest wall mass. No acute rib or sternal fracture. Likely acute T9 compression fracture with at least 40% vertebral body height loss. Similar-appearing chronic T11-T12 compression fractures. Multilevel old healed bilateral rib fractures. ABDOMEN / PELVIS FINDINGS: Hepatobiliary: Borderline enlarged measuring up to 19 cm. Nonspecific subcentimeter hypodensities too small to characterize. No laceration or subcapsular hematoma. Status post cholecystectomy.  No biliary ductal dilatation. Pancreas: Question 2 x 3 hypodense mass of the uncinate process poorly visualized in the setting of motion artifact (3:36). Otherwise normal pancreatic contour. No main pancreatic duct dilatation. Spleen: Not enlarged. No focal lesion. No laceration, subcapsular  hematoma, or vascular injury. Adrenals/Urinary Tract: No nodularity bilaterally. Bilateral kidneys enhance symmetrically. No hydronephrosis. No contusion, laceration, or subcapsular hematoma. No injury to the vascular structures or collecting systems. No hydroureter. The urinary bladder is unremarkable. Stomach/Bowel: No small or large bowel wall thickening or dilatation. The appendix is unremarkable. Vasculature/Lymphatics: Severe atherosclerotic plaque. No abdominal aorta or iliac aneurysm. No active contrast extravasation or pseudoaneurysm. No abdominal, pelvic, inguinal lymphadenopathy. Reproductive: Normal. Other: No simple free fluid ascites. No pneumoperitoneum. No hemoperitoneum. No mesenteric hematoma identified. No organized fluid collection. Musculoskeletal: No significant soft tissue hematoma. Severe degenerative changes of the bilateral hips. No acute pelvic fracture. Please see separately dictated CT lumbar spine 01/24/2024 Other ports and devices: None. Review of the MIP images confirms the above findings. IMPRESSION: 1. No pulmonary embolus with limited evaluation of the subsegmental level. 2. Right lower lobe peribronchovascular ground-glass and consolidative  airspace opacities. 3. Likely acute T9 compression fracture with at least 40% vertebral body height loss. 4. No acute intrathoracic, intra-abdominal, intrapelvic traumatic injury. 5. Similar-appearing chronic T11-T12 compression fractures. 6. Please see separately dictated CT lumbar spine 01/24/2024. 7. Aortic Atherosclerosis (ICD10-I70.0) and Emphysema (ICD10-J43.9). 8. Question 2 x 3 hypodense mass of the uncinate process poorly visualized in the setting of motion artifact. When the patient is clinically stable and able to follow directions and hold their breath (preferably as an outpatient) further evaluation with dedicated MRI pancreatic protocol should be considered. Electronically Signed   By: Morgane  Naveau M.D.   On: 01/24/2024 20:43    CT ABDOMEN PELVIS W CONTRAST Result Date: 01/24/2024 CLINICAL DATA:  Abdominal trauma, blunt; Pulmonary embolism (PE) suspected, high prob. Trauma blunt. obtunded. Chest pain. EXAM: CT ANGIOGRAPHY CHEST CT ABDOMEN AND PELVIS WITH CONTRAST TECHNIQUE: Multidetector CT imaging of the chest was performed using the standard protocol during bolus administration of intravenous contrast. Multiplanar CT image reconstructions and MIPs were obtained to evaluate the vascular anatomy. Multidetector CT imaging of the abdomen and pelvis was performed using the standard protocol during bolus administration of intravenous contrast. RADIATION DOSE REDUCTION: This exam was performed according to the departmental dose-optimization program which includes automated exposure control, adjustment of the mA and/or kV according to patient size and/or use of iterative reconstruction technique. CONTRAST:  75mL OMNIPAQUE  IOHEXOL  350 MG/ML SOLN COMPARISON:  None Available. FINDINGS: CTA CHEST FINDINGS Cardiovascular: Satisfactory opacification of the pulmonary arteries to the segmental level. Limited evaluation of subsegmental level due to motion artifact. No evidence of pulmonary embolism. Normal heart size. No pericardial effusion. Four-vessel coronary calcification. Severe atherosclerotic plaque. Mediastinum/Nodes: No pneumomediastinum. No mediastinal hematoma. The esophagus is unremarkable. The thyroid is unremarkable. The central airways are patent. No mediastinal, hilar, or axillary lymphadenopathy. Lungs/Pleura: Centrilobular emphysematous changes. Right lower lobe peribronchovascular ground-glass and consolidative airspace opacities. No pulmonary nodule. No pulmonary mass. No pulmonary contusion or laceration. No pneumatocele formation. No pleural effusion. No pneumothorax. No hemothorax. Musculoskeletal/Chest wall: No chest wall mass. No acute rib or sternal fracture. Likely acute T9 compression fracture with at least 40% vertebral  body height loss. Similar-appearing chronic T11-T12 compression fractures. Multilevel old healed bilateral rib fractures. ABDOMEN / PELVIS FINDINGS: Hepatobiliary: Borderline enlarged measuring up to 19 cm. Nonspecific subcentimeter hypodensities too small to characterize. No laceration or subcapsular hematoma. Status post cholecystectomy.  No biliary ductal dilatation. Pancreas: Question 2 x 3 hypodense mass of the uncinate process poorly visualized in the setting of motion artifact (3:36). Otherwise normal pancreatic contour. No main pancreatic duct dilatation. Spleen: Not enlarged. No focal lesion. No laceration, subcapsular hematoma, or vascular injury. Adrenals/Urinary Tract: No nodularity bilaterally. Bilateral kidneys enhance symmetrically. No hydronephrosis. No contusion, laceration, or subcapsular hematoma. No injury to the vascular structures or collecting systems. No hydroureter. The urinary bladder is unremarkable. Stomach/Bowel: No small or large bowel wall thickening or dilatation. The appendix is unremarkable. Vasculature/Lymphatics: Severe atherosclerotic plaque. No abdominal aorta or iliac aneurysm. No active contrast extravasation or pseudoaneurysm. No abdominal, pelvic, inguinal lymphadenopathy. Reproductive: Normal. Other: No simple free fluid ascites. No pneumoperitoneum. No hemoperitoneum. No mesenteric hematoma identified. No organized fluid collection. Musculoskeletal: No significant soft tissue hematoma. Severe degenerative changes of the bilateral hips. No acute pelvic fracture. Please see separately dictated CT lumbar spine 01/24/2024 Other ports and devices: None. Review of the MIP images confirms the above findings. IMPRESSION: 1. No pulmonary embolus with limited evaluation of the subsegmental level. 2. Right lower lobe peribronchovascular ground-glass  and consolidative airspace opacities. 3. Likely acute T9 compression fracture with at least 40% vertebral body height loss. 4. No acute  intrathoracic, intra-abdominal, intrapelvic traumatic injury. 5. Similar-appearing chronic T11-T12 compression fractures. 6. Please see separately dictated CT lumbar spine 01/24/2024. 7. Aortic Atherosclerosis (ICD10-I70.0) and Emphysema (ICD10-J43.9). 8. Question 2 x 3 hypodense mass of the uncinate process poorly visualized in the setting of motion artifact. When the patient is clinically stable and able to follow directions and hold their breath (preferably as an outpatient) further evaluation with dedicated MRI pancreatic protocol should be considered. Electronically Signed   By: Morgane  Naveau M.D.   On: 01/24/2024 20:43   CT L-SPINE NO CHARGE Result Date: 01/24/2024 CLINICAL DATA:  Trauma blunt. obtunded EXAM: CT LUMBAR SPINE WITHOUT CONTRAST TECHNIQUE: Multidetector CT imaging of the lumbar spine was performed without intravenous contrast administration. Multiplanar CT image reconstructions were also generated. RADIATION DOSE REDUCTION: This exam was performed according to the departmental dose-optimization program which includes automated exposure control, adjustment of the mA and/or kV according to patient size and/or use of iterative reconstruction technique. COMPARISON:  MRI lumbar spine 12/23/2023, ct lumbar 10/30/20 FINDINGS: Segmentation: 5 lumbar type vertebrae. Alignment: Similar straightening of the lumbar spine. Vertebrae: Similar-appearing T11 and T12 anterior wedge compression fracture. Similar-appearing moderate L1 compression fracture. Similar-appearing severe L2 and L4 compression fractures. Severe multilevel degenerative changes of the spine. No definite acute fracture or focal pathologic process. Paraspinal and other soft tissues: Negative. Disc levels: Multilevel intervertebral disc space vacuum phenomenon. Other: Severe atherosclerotic plaque. Bilateral hip degenerative changes. Please see separately dictated CT chest abdomen pelvis 01/24/2024 IMPRESSION: 1. No acute displaced fracture  or traumatic listhesis of the lumbar spine. 2. Similar-appearing T11 and T12 anterior wedge compression fracture. Similar-appearing moderate L1 compression fracture. Similar-appearing severe L2 and L4 compression fractures. Correlate with point tenderness to palpation to evaluate for an acute component. Electronically Signed   By: Morgane  Naveau M.D.   On: 01/24/2024 20:29   CT Head Wo Contrast Result Date: 01/24/2024 CLINICAL DATA:  Head trauma, moderate-severe; Neck trauma, intoxicated or obtunded (Age >= 16y) EXAM: CT HEAD WITHOUT CONTRAST CT CERVICAL SPINE WITHOUT CONTRAST TECHNIQUE: Multidetector CT imaging of the head and cervical spine was performed following the standard protocol without intravenous contrast. Multiplanar CT image reconstructions of the cervical spine were also generated. RADIATION DOSE REDUCTION: This exam was performed according to the departmental dose-optimization program which includes automated exposure control, adjustment of the mA and/or kV according to patient size and/or use of iterative reconstruction technique. COMPARISON:  None Available. FINDINGS: CT HEAD FINDINGS Brain: No evidence of large-territorial acute infarction. No parenchymal hemorrhage. No mass lesion. No extra-axial collection. No mass effect or midline shift. No hydrocephalus. Basilar cisterns are patent. Vascular: No hyperdense vessel. Atherosclerotic calcifications are present within the cavernous internal carotid and vertebral arteries. Skull: No acute fracture or focal lesion. Sinuses/Orbits: Paranasal sinuses and mastoid air cells are clear. The orbits are unremarkable. Other: None. CT CERVICAL SPINE FINDINGS Alignment: Normal. Skull base and vertebrae: Multilevel moderate degenerative changes of the spine. No severe osseous neural foraminal or central canal stenosis. No acute fracture. No aggressive appearing focal osseous lesion or focal pathologic process. Soft tissues and spinal canal: No prevertebral  fluid or swelling. No visible canal hematoma. Upper chest: Centrilobular emphysematous changes. Other: Atherosclerotic plaque of the aortic arch and its main branches. IMPRESSION: 1. No acute intracranial abnormality. 2. No acute displaced fracture or traumatic listhesis of the cervical spine. 3. Aortic Atherosclerosis (ICD10-I70.0)  and Emphysema (ICD10-J43.9). Electronically Signed   By: Morgane  Naveau M.D.   On: 01/24/2024 20:13   CT Cervical Spine Wo Contrast Result Date: 01/24/2024 CLINICAL DATA:  Head trauma, moderate-severe; Neck trauma, intoxicated or obtunded (Age >= 16y) EXAM: CT HEAD WITHOUT CONTRAST CT CERVICAL SPINE WITHOUT CONTRAST TECHNIQUE: Multidetector CT imaging of the head and cervical spine was performed following the standard protocol without intravenous contrast. Multiplanar CT image reconstructions of the cervical spine were also generated. RADIATION DOSE REDUCTION: This exam was performed according to the departmental dose-optimization program which includes automated exposure control, adjustment of the mA and/or kV according to patient size and/or use of iterative reconstruction technique. COMPARISON:  None Available. FINDINGS: CT HEAD FINDINGS Brain: No evidence of large-territorial acute infarction. No parenchymal hemorrhage. No mass lesion. No extra-axial collection. No mass effect or midline shift. No hydrocephalus. Basilar cisterns are patent. Vascular: No hyperdense vessel. Atherosclerotic calcifications are present within the cavernous internal carotid and vertebral arteries. Skull: No acute fracture or focal lesion. Sinuses/Orbits: Paranasal sinuses and mastoid air cells are clear. The orbits are unremarkable. Other: None. CT CERVICAL SPINE FINDINGS Alignment: Normal. Skull base and vertebrae: Multilevel moderate degenerative changes of the spine. No severe osseous neural foraminal or central canal stenosis. No acute fracture. No aggressive appearing focal osseous lesion or focal  pathologic process. Soft tissues and spinal canal: No prevertebral fluid or swelling. No visible canal hematoma. Upper chest: Centrilobular emphysematous changes. Other: Atherosclerotic plaque of the aortic arch and its main branches. IMPRESSION: 1. No acute intracranial abnormality. 2. No acute displaced fracture or traumatic listhesis of the cervical spine. 3. Aortic Atherosclerosis (ICD10-I70.0) and Emphysema (ICD10-J43.9). Electronically Signed   By: Morgane  Naveau M.D.   On: 01/24/2024 20:13   DG Pelvis Portable Result Date: 01/24/2024 CLINICAL DATA:  chest pain, fall EXAM: PORTABLE PELVIS 1-2 VIEWS COMPARISON:  CT abdomen pelvis 01/24/2024 FINDINGS: There is no evidence of pelvic fracture or diastasis. Moderate to severe degenerative changes of bilateral hips. No pelvic bone lesions are seen. Atherosclerotic plaque. IMPRESSION: Negative for acute traumatic injury. Electronically Signed   By: Morgane  Naveau M.D.   On: 01/24/2024 20:10   DG Chest Portable 1 View Result Date: 01/24/2024 CLINICAL DATA:  chest pain, fall EXAM: PORTABLE CHEST 1 VIEW COMPARISON:  CT angiography chest 01/24/2024 FINDINGS: The heart and mediastinal contours are within normal limits. Atherosclerotic plaque. Emphysematous changes. Right lower lung zone airspace and interstitial opacities. No pulmonary edema. No pleural effusion. No pneumothorax. No acute osseous abnormality.  Old healed bilateral rib fractures. IMPRESSION: 1. Right lower lung zone airspace and interstitial opacities consistent with infection/inflammation. Followup PA and lateral chest X-ray is recommended in 3-4 weeks following therapy to ensure resolution. 2. Aortic Atherosclerosis (ICD10-I70.0) and Emphysema (ICD10-J43.9). Electronically Signed   By: Morgane  Naveau M.D.   On: 01/24/2024 20:08      Rosena Conradi, MD  [facility] 01/26/2024  If 7PM-7AM, please contact night-coverage

## 2024-01-27 ENCOUNTER — Inpatient Hospital Stay: Admission: RE | Admit: 2024-01-27 | Source: Ambulatory Visit

## 2024-01-27 DIAGNOSIS — J189 Pneumonia, unspecified organism: Secondary | ICD-10-CM

## 2024-01-27 DIAGNOSIS — J9601 Acute respiratory failure with hypoxia: Secondary | ICD-10-CM

## 2024-01-27 LAB — CBC
HCT: 42.2 % (ref 39.0–52.0)
Hemoglobin: 14.5 g/dL (ref 13.0–17.0)
MCH: 34.3 pg — ABNORMAL HIGH (ref 26.0–34.0)
MCHC: 34.4 g/dL (ref 30.0–36.0)
MCV: 99.8 fL (ref 80.0–100.0)
Platelets: 168 10*3/uL (ref 150–400)
RBC: 4.23 MIL/uL (ref 4.22–5.81)
RDW: 11.8 % (ref 11.5–15.5)
WBC: 12.1 10*3/uL — ABNORMAL HIGH (ref 4.0–10.5)
nRBC: 0 % (ref 0.0–0.2)

## 2024-01-27 LAB — COMPREHENSIVE METABOLIC PANEL WITH GFR
ALT: 15 U/L (ref 0–44)
AST: 27 U/L (ref 15–41)
Albumin: 2.8 g/dL — ABNORMAL LOW (ref 3.5–5.0)
Alkaline Phosphatase: 86 U/L (ref 38–126)
Anion gap: 9 (ref 5–15)
BUN: 6 mg/dL — ABNORMAL LOW (ref 8–23)
CO2: 37 mmol/L — ABNORMAL HIGH (ref 22–32)
Calcium: 8.7 mg/dL — ABNORMAL LOW (ref 8.9–10.3)
Chloride: 84 mmol/L — ABNORMAL LOW (ref 98–111)
Creatinine, Ser: 0.41 mg/dL — ABNORMAL LOW (ref 0.61–1.24)
GFR, Estimated: >60 mL/min (ref >60)
Glucose, Bld: 84 mg/dL (ref 70–99)
Potassium: 4.6 mmol/L (ref 3.5–5.1)
Sodium: 130 mmol/L — ABNORMAL LOW (ref 135–145)
Total Bilirubin: 1 mg/dL (ref 0.0–1.2)
Total Protein: 6.1 g/dL — ABNORMAL LOW (ref 6.5–8.1)

## 2024-01-27 LAB — MAGNESIUM: Magnesium: 1.7 mg/dL (ref 1.7–2.4)

## 2024-01-27 LAB — PHOSPHORUS: Phosphorus: 3.7 mg/dL (ref 2.5–4.6)

## 2024-01-27 MED ORDER — CARVEDILOL 12.5 MG PO TABS
12.5000 mg | ORAL_TABLET | Freq: Two times a day (BID) | ORAL | Status: DC
  Administered 2024-01-27 – 2024-01-28 (×3): 12.5 mg via ORAL
  Filled 2024-01-27 (×3): qty 1

## 2024-01-27 MED ORDER — LIDOCAINE 5 % EX PTCH
1.0000 | MEDICATED_PATCH | CUTANEOUS | Status: DC
Start: 2024-01-27 — End: 2024-01-29
  Administered 2024-01-27 – 2024-01-28 (×2): 1 via TRANSDERMAL
  Filled 2024-01-27 (×2): qty 1

## 2024-01-27 MED ORDER — SODIUM CHLORIDE 0.9 % IV BOLUS
500.0000 mL | Freq: Once | INTRAVENOUS | Status: AC
  Administered 2024-01-27: 500 mL via INTRAVENOUS

## 2024-01-27 MED ORDER — GUAIFENESIN 100 MG/5ML PO LIQD: 5.0000 mL | ORAL | Status: DC | PRN

## 2024-01-27 NOTE — Care Management Important Message (Signed)
 Important Message  Patient Details  Name: Dustin Park MRN: [id number] Date of Birth: 01/10/1946   Important Message Given:  Yes - Medicare IM     Felix Host 01/27/2024, 5:02 PM

## 2024-01-27 NOTE — Plan of Care (Signed)
   Problem: Education: Goal: Knowledge of General Education information will improve Description: Including pain rating scale, medication(s)/side effects and non-pharmacologic comfort measures Outcome: Progressing   Problem: Nutrition: Goal: Adequate nutrition will be maintained Outcome: Progressing   Problem: Coping: Goal: Level of anxiety will decrease Outcome: Progressing

## 2024-01-27 NOTE — Progress Notes (Signed)
 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 Also has an alcohol history.    PT Comments  Patient progressing with ambulation into hallway and able to improve posture with cues/instructions.  Not used to walker as using cane at baseline, though understands he needs RW for now though concerned about up 3 steps to bathroom.  SpO2 down to 83% with hallway ambulation on RW.  MAy need home O2.  Feel he will continue to benefit from skilled PT in the acute setting.  Hopeful for home with HHPT follow up.     If plan is discharge home, recommend the following: A lot of help with walking and/or transfers;A lot of help with bathing/dressing/bathroom   Can travel by private vehicle        Equipment Recommendations  Rolling walker (2 wheels);BSC/3in1;Wheelchair (measurements PT)    Recommendations for Other Services       Precautions / Restrictions Precautions Precautions: Fall Recall of Precautions/Restrictions: Intact     Mobility  Bed Mobility               General bed mobility comments: in recliner    Transfers Overall transfer level: Needs assistance Equipment used: Rolling walker (2 wheels) Transfers: Sit to/from Stand Sit to Stand: Contact guard assist           General transfer comment: increased time to come up to stand    Ambulation/Gait Ambulation/Gait assistance: Contact guard assist, Supervision Gait Distance (Feet): 75 Feet Assistive device: Rolling walker (2 wheels) Gait Pattern/deviations: Step-through pattern, Decreased stride length, Trunk flexed, Wide base of support, Shuffle       General Gait Details: head and neck flexion and trunk flexed,  pt tends to hold walker at arms length; cues and assist for positioning in side walker with decreased trunk flexion, pt relates makes him dizzy to extend the neck so encouraged mainly trunk upright; several stops while on O2 vs RA to check SpO2   Stairs             Wheelchair Mobility     Tilt Bed    Modified Rankin (Stroke Patients Only)       Balance Overall balance assessment: Needs assistance Sitting-balance support: Feet supported Sitting balance-Leahy Scale: Fair     Standing balance support: Bilateral upper extremity supported, Reliant on assistive device for balance Standing balance-Leahy Scale: Poor Standing balance comment: UE support or assist for balance                            Communication Communication Communication: No apparent difficulties  Cognition Arousal: Alert Behavior During Therapy: WFL for tasks assessed/performed   PT - Cognitive impairments: No apparent impairments                         Following commands: Intact      Cueing    Exercises      General Comments General comments (skin integrity, edema, etc.): SpO2 at rest on RA 86% after blowing his nose, rose to 92% with PLB without placing O2, on RA with ambulation SpO2 83% placed on 2LPM SpO2 95%; IS x 3 end  of session up to and pt able to have productive cough      Pertinent Vitals/Pain Pain Assessment Faces Pain Scale: Hurts even more Pain Location: back & rib pain Pain Descriptors / Indicators: Grimacing, Guarding, Aching Pain Intervention(s): Monitored during session, Repositioned    Home Living                          Prior Function            PT Goals (current goals can now be found in the care plan section) Progress towards PT goals: Progressing toward goals    Frequency           PT Plan      Co-evaluation              AM-PAC PT "6 Clicks" Mobility   Outcome Measure  Help needed turning from your  back to your side while in a flat bed without using bedrails?: A Little Help needed moving from lying on your back to sitting on the side of a flat bed without using bedrails?: A Little Help needed moving to and from a bed to a chair (including a wheelchair)?: A Little Help needed standing up from a chair using your arms (e.g., wheelchair or bedside chair)?: A Little Help needed to walk in hospital room?: A Little Help needed climbing 3-5 steps with a railing? : Total 6 Click Score: 16    End of Session Equipment Utilized During Treatment: Gait belt;Oxygen Activity Tolerance: Patient tolerated treatment well Patient left: in chair;with call bell/phone within reach;with chair alarm set   PT Visit Diagnosis: Unsteadiness on feet (R26.81);Other abnormalities of gait and mobility (R26.89);History of falling (Z91.81);Muscle weakness (generalized) (M62.81);Adult, failure to thrive (R62.7)     Time: 9629-5284 PT Time Calculation (min) (ACUTE ONLY): 30 min  Charges:    $Gait Training: 8-22 mins $Therapeutic Activity: 8-22 mins PT General Charges $$ ACUTE PT VISIT: 1 Visit                     Abigail Hoff, PT Acute Rehabilitation Services Office:[phone] 01/27/2024    Dustin Park 01/27/2024, 5:25 PM

## 2024-01-27 NOTE — Progress Notes (Signed)
 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  Family had seen him minutes prior and he was fine.  In the ED he was poorly responsive.  Labs showed a sodium of 115 with WBC elevated at 17.8.  Patient was initially admitted to the ICU for severe hyponatremia.  Currently transferred out of the ICU service to [facility].    Assessment/Plan: Principal Problem:   Hyponatremia syndrome Active Problems:   Pressure injury of skin   Acute hypoxic respiratory failure (HCC)   Aspiration pneumonia (HCC)  Hyponatremia: Thought to be hypovolemic. Received normal saline. Improving sodium levels. Sodium improved from 128-130 today. Give 500 cc IVNS bolus today. Continue to monitor.   Possible seizure Concerns for hyponatremia versus alcohol withdrawal.  Drinks a bottle of wine every day.  On CIWA protocol. No further episodes reported. Aspiration precautions.  Trauma status post fall with known spine compression fractures. CT scan of the lumbar spine abdomen and pelvis angiogram of the chest cervical spine and head CT were all negative for fractures. Continue pain management. Desatted while working with OT today.  Will likely require home health PT/OT at discharge.   Hypertension: Patient is on amlodipine  -benazepril Coreg  at home.  BP remains elevated with SBP's in the 140s to 150s. Continue amlodipine  and resume home Coreg  today.  Acute hypoxic respiratory failure secondary to right lower lobe pneumonia, possible aspiration Leukocytosis trended down to 12,000.  Remains afebrile.  O2 dropped to the  80s and nasal cannula was removed while working with OT. Currently on 2 L [place].  Will continue IV Rocephin .  Will repeat ambulation with pulse ox tomorrow morning. Continue DuoNebs, incentive spirometry and flutter valve  Hypophosphatemia, resolved.  Phosphate of 3.7 today  Pressure injury sacral stage II present on admission.  Will continue with wound care. Pressure Injury 01/24/24 Sacrum Mid Stage 2 -  Partial thickness loss of dermis presenting as a shallow open injury with a red, pink wound bed without slough. (Active)  01/24/24 2020  Location: Sacrum  Location Orientation: Mid  Staging: Stage 2 -  Partial thickness loss of dermis presenting as a shallow open injury with a red, pink wound bed without slough.  Wound Description (Comments):   Present on Admission: Yes     DVT prophylaxis: heparin  injection 5,000 Units Start: 01/24/24 2200 SCDs Start: 01/24/24 1835   Disposition: Likely home with PT/OT tomorrow  Status is: Inpatient  Remains inpatient appropriate because: Hyponatremia, hypoxic respiratory failure requiring 2L [place] oxygen, pending clinical improvement    Code Status:     Code Status: Full Code  Family Communication: Discussed plan with spouse at bedside  Consultants: PCCM  Procedures: None  Anti-infectives:  Rocephin  IV  Anti-infectives (From admission, onward)    Start     Dose/Rate Route Frequency Ordered Stop   01/25/24 1915  cefTRIAXone  (ROCEPHIN ) 2 g in sodium chloride  0.9 % 100 mL IVPB        2 g 200 mL/hr over 30 Minutes Intravenous Every 24 hours 01/25/24 0849 01/29/24 1759   01/24/24 1915  cefTRIAXone  (ROCEPHIN ) 1 g in sodium chloride   0.9 % 100 mL IVPB  Status:  Discontinued        1 g 200 mL/hr over 30 Minutes Intravenous Every 24 hours 01/24/24 1902 01/25/24 0849       Subjective: Today, patient was seen and examined at bedside with spouse in the room.  Patient reports he is frustrated and would like to leave the hospital.  He does not his  current dysphagia diet.  Spouse states that patient now has dentures and will need reassessment of his diet.  He continues to have occasional cough.  Patient agrees to stay today but states he would not stay past tomorrow.  Objective: Vitals:   01/27/24 0515 01/27/24 0814  BP: 128/64 (!) 154/90  Pulse: 70 79  Resp: 16 16  Temp: 97.6 F (36.4 C) (!) 97.5 F (36.4 C)  SpO2: 100% 100%    Intake/Output Summary (Last 24 hours) at 01/27/2024 0833 Last data filed at 01/27/2024 0500 Gross per 24 hour  Intake 720 ml  Output 1450 ml  Net -730 ml   Filed Weights   01/25/24 0500 01/26/24 0500  Weight: 55.6 kg 57.6 kg   Body mass index is 17.71 kg/m.   Physical Exam:  General: Chronically ill elderly man laying in bed. No acute distress. HEENT: [place]/AT. Anicteric sclera. Dry mucous membrane. CV: RRR. No murmurs, rubs, or gallops. No LE edema Pulmonary: On 2 L [place]. Lungs CTAB. Coarse and diminished lung sounds. No wheezing or rales. Abdominal: Soft, nontender, nondistended. Normal bowel sounds. Extremities: Palpable radial and DP pulses. Normal ROM. Skin: Warm and dry. No obvious rash or lesions. Neuro: A&Ox3. Moves all extremities. Normal sensation to light touch. No focal deficit. Psych: Normal mood and affect   Data Review: I have personally reviewed the following laboratory data and studies,  CBC: Recent Labs  Lab 01/24/24 1617 01/26/24 0717 01/27/24 0710  WBC 17.8* 16.3* 12.1*  NEUTROABS 16.2* 14.2*  --   HGB 14.6 14.6 14.5  HCT 39.3 41.2 42.2  MCV 94.2 98.1 99.8  PLT 192 197 168   Basic Metabolic Panel: Recent Labs  Lab 01/25/24 1238 01/25/24 1821 01/26/24 0015 01/26/24 0717 01/27/24 0710  NA 123* 125* 127* 128* 130*  K 4.1 4.4 4.1 4.4 4.6  CL 77* 80* 83* 81* 84*  CO2 35* 34* 35* 32 37*  GLUCOSE 84 102* 132* 101* 84  BUN <5* <5* <5* <5* 6*  CREATININE 0.47* 0.44* 0.38* 0.34* 0.41*  CALCIUM 8.9 8.5* 8.5* 8.7* 8.7*  MG  --   --   --  1.7 1.7  PHOS  --   --    --  2.3* 3.7   Liver Function Tests: Recent Labs  Lab 01/24/24 1617 01/27/24 0710  AST 36 27  ALT 16 15  ALKPHOS 114 86  BILITOT 1.2 1.0  PROT 6.6 6.1*  ALBUMIN 3.2* 2.8*   No results for input(s): "LIPASE", "AMYLASE" in the last 168 hours. No results for input(s): "AMMONIA" in the last 168 hours. Cardiac Enzymes: No results for input(s): "CKTOTAL", "CKMB", "CKMBINDEX", "TROPONINI" in the last 168 hours. BNP (last 3 results) No results for input(s): "BNP" in the last 8760 hours.  ProBNP (last 3 results) No results for input(s): "PROBNP" in the last 8760 hours.  CBG: Recent Labs  Lab 01/25/24 0342 01/25/24 0752 01/25/24 1121 01/25/24 1524 01/25/24 1950  GLUCAP 94 98 73 89 134*   Recent Results (from the past 240 hours)  MRSA Next Gen by PCR, Nasal  Status: None   Collection Time: 01/24/24  7:57 PM   Specimen: Nasal Mucosa; Nasal Swab  Result Value Ref Range Status   MRSA by PCR Next Gen NOT DETECTED NOT DETECTED Final    Comment: (NOTE) The GeneXpert MRSA Assay (FDA approved for NASAL specimens only), is one component of a comprehensive MRSA colonization surveillance program. It is not intended to diagnose MRSA infection nor to guide or monitor treatment for MRSA infections. Test performance is not FDA approved in patients less than 72 years old. Performed at [facility], [address] [place], [place] [address]      Studies: No results found.     Vita Grip, MD  [facility] 01/27/2024  If 7PM-7AM, please contact night-coverage

## 2024-01-27 NOTE — Evaluation (Signed)
 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 Also has an alcohol history.     Clinical Impressions Pt admitted based on above, and was seen based on problem list below. PTA pt was independent with ADLs and IADLs. Per pt history of falls and decreased activity tolerance over the past couple of months. Today pt is requiring set up  to min  assist for ADLs. Functional transfers are  CGA. Per pt plan is to d/c home and sleep in recliner and use bsc in garage living room area. Pt received on 3.5L of O2, when attempting sitting on RA pt destat down to 84, and placed on 2L. During standing ADL task pt reporting fatigue, and leaning on sink significantly. Pt declining to take seated rest break, despite multiple offers. Pt able to complete tasks standing with increased time, and experiencing extreme fatigue. Anticipate pt will need  HHOT to continue to build activity tolerance and strength. OT will continue to follow acutely to maximize functional independence.        If plan is discharge home, recommend the following:   A little help with walking and/or transfers;A little help with bathing/dressing/bathroom;Assistance with cooking/housework;Supervision due to cognitive status     Functional Status Assessment   Patient has had a recent decline in their functional status and demonstrates the ability to make significant improvements in function in a reasonable and predictable amount of time.     Equipment Recommendations   BSC/3in1     Recommendations for Other Services         Precautions/Restrictions   Precautions Precautions: Fall Recall of  Precautions/Restrictions: Intact Restrictions Weight Bearing Restrictions Per Provider Order: No     Mobility Bed Mobility Overal bed mobility: Needs Assistance             General bed mobility comments: Pt received sitting in recliner per wife, pt sleeps in recliner at home    Transfers Overall transfer level: Needs assistance Equipment used: Rolling walker (2 wheels) Transfers: Sit to/from Stand, Bed to chair/wheelchair/BSC Sit to Stand: Contact guard assist     Step pivot transfers: Contact guard assist     General transfer comment: With increased time pt slow to come to stand requiring rest breaks while standing      Balance Overall balance assessment: Needs assistance, History of Falls Sitting-balance support: No upper extremity supported, Feet supported Sitting balance-Leahy Scale: Fair     Standing balance support: Bilateral upper extremity supported, During functional activity, Reliant on assistive device for balance Standing balance-Leahy Scale: Poor       ADL either performed or assessed with clinical judgement   ADL Overall ADL's : Needs assistance/impaired Eating/Feeding: Set up;Sitting   Grooming: Wash/dry face;Contact guard assist;Standing Grooming Details (indicate cue type and reason): Pt able to stand at sink <5 minutes, c/o of fatigue, leaning on sink         Upper Body Dressing : Set up;Sitting   Lower Body Dressing: Minimal assistance;Sit to/from stand Lower Body Dressing Details (indicate cue type and reason): Able to reach feet with increased time would be min assist for pants Toilet Transfer: Contact guard assist;Ambulation;Rolling walker (2 wheels) Toilet Transfer Details (indicate cue type and  reason): Pt requiring rest breaks         Functional mobility during ADLs: Contact guard assist;Rolling walker (2 wheels) General ADL Comments: Pt with significantly decreased activity tolerance     Vision Baseline Vision/History: 0 No  visual deficits Vision Assessment?: No apparent visual deficits            Pertinent Vitals/Pain Pain Assessment Pain Assessment: Faces Faces Pain Scale: Hurts even more Pain Location: back & rib pain Pain Descriptors / Indicators: Grimacing, Guarding, Aching Pain Intervention(s): Monitored during session, Repositioned     Extremity/Trunk Assessment Upper Extremity Assessment Upper Extremity Assessment: Generalized weakness;Overall [facility] for tasks assessed   Lower Extremity Assessment Lower Extremity Assessment: Defer to PT evaluation   Cervical / Trunk Assessment Cervical / Trunk Assessment: Kyphotic   Communication Communication Communication: No apparent difficulties   Cognition Arousal: Alert Behavior During Therapy: Flat affect Cognition: Cognition impaired     Awareness: Intellectual awareness intact, Online awareness impaired     Executive functioning impairment (select all impairments): Reasoning OT - Cognition Comments: Poor problem solving and safety awareness.                 Following commands: Intact       Cueing  General Comments   Cueing Techniques: Verbal cues;Tactile cues  Pt received on 3.5 L of O2, per RN Heard removed, pt destat while seated to 84, placed on 2L [place], during mobility O2 stats at 94 with 2L           Home Living Family/patient expects to be discharged to:: Private residence Living Arrangements: Spouse/significant other Available Help at Discharge: Family;Available 24 hours/day Type of Home: House Home Access: Stairs to enter [employer] of Steps: 1   Home Layout: Multi-level;Other (Comment) (Plan is to stay in garage which has living room set up) Alternate Level Stairs-Number of Steps: 3 (For house entrance to get to bathroom) Alternate Level Stairs-Rails: Left;Right Bathroom Shower/Tub: Producer, television/film/video: Standard Bathroom Accessibility: Yes How Accessible: Accessible via walker Home  Equipment: Shower seat;Cane - single point   Additional Comments: Pt spends most of his time in the garage (where there is a den-style setup; sleeps in recliner in garage; 3 steps to rest of house to access bathroom in particular      Prior Functioning/Environment Prior Level of Function : Independent/Modified Independent             Mobility Comments: in house no AD, cane for community mobility ADLs Comments: Independent (Significantly decreased activity tolerance)    OT Problem List: Decreased strength;Decreased range of motion;Decreased activity tolerance;Impaired balance (sitting and/or standing);Decreased safety awareness;Decreased knowledge of use of DME or AE   OT Treatment/Interventions: Self-care/ADL training;Therapeutic exercise;DME and/or AE instruction;Energy conservation;Therapeutic activities;Patient/family education;Balance training      OT Goals(Current goals can be found in the care plan section)   Acute Rehab OT Goals Patient Stated Goal: To go home OT Goal Formulation: With patient Time For Goal Achievement: 02/10/24 Potential to Achieve Goals: Good   OT Frequency:  Min 2X/week       AM-PAC OT "6 Clicks" Daily Activity     Outcome Measure Help from another person eating meals?: None Help from another person taking care of personal grooming?: A Little Help from another person toileting, which includes using toliet, bedpan, or urinal?: A Little Help from another person bathing (including washing, rinsing, drying)?: A Little Help from another person to put on and taking off regular upper body  clothing?: A Little Help from another person to put on and taking off regular lower body clothing?: A Little 6 Click Score: 19   End of Session Equipment Utilized During Treatment: Gait belt;Rolling walker (2 wheels) Nurse Communication: Mobility status  Activity Tolerance: Patient limited by fatigue;Patient limited by pain Patient left: in chair;with call  bell/phone within reach;with chair alarm set;with family/visitor present  OT Visit Diagnosis: Unsteadiness on feet (R26.81);Other abnormalities of gait and mobility (R26.89);Repeated falls (R29.6);History of falling (Z91.81);Muscle weakness (generalized) (M62.81);Adult, failure to thrive (R62.7)                Time: 0831-0920 OT Time Calculation (min): 49 min Charges:  OT General Charges $OT Visit: 1 Visit OT Evaluation $OT Eval Low Complexity: 1 Low OT Treatments $Self Care/Home Management : 23-37 mins  Delmer Ferraris, OT  Acute Rehabilitation Services Office [phone] Secure chat preferred   Mickael Alamo 01/27/2024, 10:52 AM

## 2024-01-28 ENCOUNTER — Other Ambulatory Visit (HOSPITAL_COMMUNITY): Payer: Self-pay

## 2024-01-28 DIAGNOSIS — J9601 Acute respiratory failure with hypoxia: Secondary | ICD-10-CM | POA: Diagnosis not present

## 2024-01-28 DIAGNOSIS — J690 Pneumonitis due to inhalation of food and vomit: Secondary | ICD-10-CM | POA: Diagnosis not present

## 2024-01-28 DIAGNOSIS — E871 Hypo-osmolality and hyponatremia: Secondary | ICD-10-CM | POA: Diagnosis not present

## 2024-01-28 LAB — BASIC METABOLIC PANEL WITH GFR
Anion gap: 12 (ref 5–15)
BUN: 6 mg/dL — ABNORMAL LOW (ref 8–23)
CO2: 37 mmol/L — ABNORMAL HIGH (ref 22–32)
Calcium: 8.8 mg/dL — ABNORMAL LOW (ref 8.9–10.3)
Chloride: 83 mmol/L — ABNORMAL LOW (ref 98–111)
Creatinine, Ser: 0.45 mg/dL — ABNORMAL LOW (ref 0.61–1.24)
GFR, Estimated: >60 mL/min (ref >60)
Glucose, Bld: 76 mg/dL (ref 70–99)
Potassium: 5.5 mmol/L — ABNORMAL HIGH (ref 3.5–5.1)
Sodium: 132 mmol/L — ABNORMAL LOW (ref 135–145)

## 2024-01-28 LAB — CBC
HCT: 39.9 % (ref 39.0–52.0)
Hemoglobin: 13.5 g/dL (ref 13.0–17.0)
MCH: 34.7 pg — ABNORMAL HIGH (ref 26.0–34.0)
MCHC: 33.8 g/dL (ref 30.0–36.0)
MCV: 102.6 fL — ABNORMAL HIGH (ref 80.0–100.0)
Platelets: 215 10*3/uL (ref 150–400)
RBC: 3.89 MIL/uL — ABNORMAL LOW (ref 4.22–5.81)
RDW: 12 % (ref 11.5–15.5)
WBC: 9.6 10*3/uL (ref 4.0–10.5)
nRBC: 0 % (ref 0.0–0.2)

## 2024-01-28 LAB — POTASSIUM: Potassium: 4 mmol/L (ref 3.5–5.1)

## 2024-01-28 MED ORDER — HYDROCODONE-ACETAMINOPHEN 5-325 MG PO TABS
1.0000 | ORAL_TABLET | Freq: Four times a day (QID) | ORAL | 0 refills | Status: DC | PRN
  Filled 2024-01-28: qty 20, 5d supply, fill #0

## 2024-01-28 MED ORDER — TIZANIDINE HCL 4 MG PO TABS
4.0000 mg | ORAL_TABLET | Freq: Every evening | ORAL | 0 refills | Status: DC | PRN
  Filled 2024-01-28: qty 30, 30d supply, fill #0

## 2024-01-28 MED ORDER — CEFDINIR 300 MG PO CAPS
300.0000 mg | ORAL_CAPSULE | Freq: Two times a day (BID) | ORAL | 0 refills | Status: AC
  Filled 2024-01-28: qty 4, 2d supply, fill #0

## 2024-01-28 MED ORDER — LIDOCAINE 5 % EX PTCH
1.0000 | MEDICATED_PATCH | CUTANEOUS | 0 refills | Status: DC
  Filled 2024-01-28: qty 30, 30d supply, fill #0

## 2024-01-28 MED ORDER — THIAMINE HCL 100 MG PO TABS
100.0000 mg | ORAL_TABLET | Freq: Every day | ORAL | 3 refills | Status: DC
  Filled 2024-01-28: qty 30, 30d supply, fill #0

## 2024-01-28 MED ORDER — GUAIFENESIN 100 MG/5ML PO LIQD
5.0000 mL | ORAL | 0 refills | Status: DC | PRN
  Filled 2024-01-28: qty 118, 4d supply, fill #0

## 2024-01-28 MED ORDER — HYDROCODONE-ACETAMINOPHEN 5-325 MG PO TABS
1.0000 | ORAL_TABLET | Freq: Four times a day (QID) | ORAL | 0 refills | Status: AC | PRN
Start: 2024-01-28 — End: 2024-02-04

## 2024-01-28 MED ORDER — SODIUM ZIRCONIUM CYCLOSILICATE 10 G PO PACK
10.0000 g | PACK | Freq: Every day | ORAL | Status: DC
  Administered 2024-01-28: 10 g via ORAL
  Filled 2024-01-28: qty 1

## 2024-01-28 MED ORDER — POLYETHYLENE GLYCOL 3350 17 GM/SCOOP PO POWD
17.0000 g | Freq: Every day | ORAL | 0 refills | Status: DC | PRN
  Filled 2024-01-28: qty 476, 28d supply, fill #0

## 2024-01-28 MED ORDER — HYDROCODONE-ACETAMINOPHEN 5-325 MG PO TABS: 1.0000 | ORAL_TABLET | Freq: Four times a day (QID) | ORAL | 0 refills | Status: DC | PRN

## 2024-01-28 NOTE — Discharge Instructions (Signed)
 Mr. Guess, It was a pleasure taking care of you at [facility]. You were admitted for low sodium and pneumonia and treated with IV fluids. We are discharging you home now that you are doing better. Please follow the following instructions.  1) Continue to wear your oxygen at all times until you follow up with your doctor next week 2) Take Cefdinir  300 mg twice daily for 2 days to finish your pneumonia treatment 3) Continue taking all your medications as prescribed 4) Follow up with your primary care or pain doctor next week  Take care,  Dr. Redge Cancel, MD, MPH

## 2024-01-28 NOTE — Progress Notes (Signed)
 Pt discharged to home with wife. Equipments and pt's belongings secured. IV removed. 2L oxygen connected to pt's oxygen machine. Pt in no apparent distress.

## 2024-01-28 NOTE — Progress Notes (Signed)
 Speech Language Pathology Treatment: Dysphagia  Patient Details Name: Dustin Park MRN: [id number] DOB: 1946/02/27 Today's Date: 01/28/2024 Time: 4098-1191 SLP Time Calculation (min) (ACUTE ONLY): 16 min  Assessment / Plan / Recommendation Clinical Impression  SLP conducted skilled therapy session targeting dysphagia goals. Upon SLP entry, patient and wife vehemently claiming that patient no longer has any need for dysphagia diet due to presence of dentures and appearing to participate only scornfully during session. SLP explained possibility of aspiration as cause of current pneumonia, however patient shook medication cup in SLP's direction and proclaimed "See, I can take my medications without choking!" after taking whole with thin liquids. SLP provided patient with regular crackers, which patient tolerated without difficulty. Across session, patient took several sips of thin liquids. X1, patient exhibited immediate cough and x1 exhibited immediate throat clear, though wet and congested cough was present prior to bolus administration. SLP provided further education re: relationship between aspiration and pneumonia. Patient stated after each aforementioned event "that was not from the water, that was from the pneumonia", thus participation in education provided was limited. Patient's wife indicates that he will be leaving today, however recommend diet check to assess for overt s/sx of penetration/aspiration with thin liquids vs. Baseline cough if patient remains at the hospital. SLP upgraded diet to regular/thin due to tolerance of solids without issue. Patient tends to eat with poor positioning due to back pain, and SLP encouraged family heavily to insure upright positioning during all PO intake, especially with thin liquids. Patient was left in room with call bell in reach and alarm set. SLP will continue to target goals per plan of care.     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 Wife reports frailty and failure to thrive over about the past year.  wife found him at the bottom of a three step flight of stairs in urine. He was poorly responsive upon arrival to the ED. Laboratory evaluation was concerning NA 115 and WBC 17.8. PCCM was called to evaluate in the setting of severe hyponatremia. Chest imaging revealed: Right lower lung zone airspace and interstitial opacities consistent with infection/inflammation. CT of head revealed no acute abnormalities.      SLP Plan  Continue with current plan of care      Recommendations for follow up therapy are one component of a multi-disciplinary discharge planning process, led by the attending physician.  Recommendations may be updated based on patient status, additional functional criteria and insurance authorization.    Recommendations  Diet recommendations: Regular;Thin liquid Liquids provided via: Cup;Straw Medication Administration: Whole meds with liquid Supervision: Patient able to self feed Compensations: Slow rate;Small sips/bites Postural Changes and/or Swallow Maneuvers: Seated upright 90 degrees                  Oral care BID   PRN Dysphagia, unspecified (R13.10)     Continue with current plan of care   Dorla Gartner, M.A., CCC-SLP   Clarence Cogswell A Kenon [place]ashmit  01/28/2024, 12:13 PM

## 2024-01-28 NOTE — Progress Notes (Signed)
    Durable Medical Equipment  (From admission, onward)           Start     Ordered   01/28/24 1516  For home use only DME oxygen  Once       Question Answer Comment  Length of Need 6 Months   Mode or (Route) Nasal cannula   Liters per Minute 2   Frequency Continuous (stationary and portable oxygen unit needed)   Oxygen conserving device Yes   Oxygen delivery system Gas      01/28/24 1515   01/28/24 1510  For home use only DME Walker rolling  Once       Question Answer Comment  Walker: With 5 Inch Wheels   Patient needs a walker to treat with the following condition Difficulty walking      01/28/24 1510   01/28/24 1510  For home use only DME 3 n 1  Once        01/28/24 1510

## 2024-01-28 NOTE — Progress Notes (Signed)
    Durable Medical Equipment  (From admission, onward)           Start     Ordered   01/28/24 1522  For home use only DME 3 n 1  Once       Comments: Bedside commode. Confine to one room.   01/28/24 1522   01/28/24 1516  For home use only DME oxygen  Once       Question Answer Comment  Length of Need 6 Months   Mode or (Route) Nasal cannula   Liters per Minute 2   Frequency Continuous (stationary and portable oxygen unit needed)   Oxygen conserving device Yes   Oxygen delivery system Gas      01/28/24 1515   01/28/24 1510  For home use only DME Walker rolling  Once       Question Answer Comment  Walker: With 5 Inch Wheels   Patient needs a walker to treat with the following condition Difficulty walking      01/28/24 1510

## 2024-01-28 NOTE — Care Management Important Message (Signed)
 Important Message  Patient Details  Name: Dustin Park MRN: [id number] Date of Birth: [birth year]   Important Message Given:  Yes - Medicare IM     Felix Host 01/28/2024, 12:42 PM

## 2024-01-28 NOTE — Discharge Summary (Signed)
 Physician Discharge Summary   Patient: Dustin Park MRN: [id number] DOB: [birth year]  Admit date:     01/24/2024  Discharge date: 01/28/24  Discharge Physician: Vita Grip   PCP: Dyana Glade, MD   Recommendations at discharge:   Repeat BMP to ensure sodium and potassium are stable Ensure patient wearing is supplemental O2, wean him off as able  Discharge Diagnoses: Principal Problem:   Hyponatremia syndrome Active Problems:   Pressure injury of skin   Acute hypoxic respiratory failure (HCC)   Aspiration pneumonia (HCC)   Community acquired pneumonia of right lower lobe of lung  Resolved Problems:   * No resolved hospital problems. 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 Family had seen him minutes prior and he was fine. In the ED he was poorly responsive. Labs showed a sodium of 115 with WBC elevated at 17.8. Patient was initially admitted to the ICU for severe hyponatremia. Currently transferred out of the ICU service to [facility]. Pt received IV antibiotics for his pneumonia. He was unable to wean off supplemental O2 so he was discharged home with oxygen.   Assessment and Plan: Hyponatremia: Thought to be hypovolemic. Received normal saline. Sodium improved to 132 on day of discharge.    Possible seizure Concerns for hyponatremia versus alcohol withdrawal.  Drinks a bottle of wine every day. Placed on CIWA protocol. No further episodes reported. Aspiration precautions.   Trauma  Status post fall with known spine compression fractures. CT scan of the lumbar spine abdomen and pelvis angiogram of the chest cervical spine and head CT were all negative for fractures.  Continue pain management. Desatted while working with OT today. Discharged home with [facility] PT/OT   Hypertension: Patient is on amlodipine  -benazepril Coreg  at home.  BP improved with SBP in the 110s-130s. Continue amlodipine  and and coreg .   Acute hypoxic respiratory failure secondary to right lower lobe pneumonia, possible aspiration Leukocytosis trended down and resolved.  Remained afebrile. Oxygen continued to drop to the 80s on room air. He was discharged home on [facility] to be weaned off by his PCP.    Hypophosphatemia, resolved.  Hyperkalemia. K+ elevated to 5.5 on morning of discharge. Improved to 4.0 after 1 dose of Lokelma   Dysphagia. Dysphagia diet advanced to regular diet prior to discharge after he put in his dentures. Discharged home with [facility]      Pain control - [place]  Controlled Substance Reporting System database was reviewed. and patient was instructed, not to drive, operate heavy machinery, perform activities at heights, swimming or participation in water activities or provide baby-sitting services while on Pain, Sleep and Anxiety Medications; until their outpatient Physician has advised to do so again. Also recommended to not to take more than prescribed Pain, Sleep and Anxiety Medications.   Consultants: PCCM Procedures performed: None  Disposition: Home health Diet recommendation:  Regular diet  DISCHARGE MEDICATION: Allergies as of 01/28/2024       Reactions   Penicillins Swelling   Latex Itching, Dermatitis        Medication List     TAKE these medications    amLODipine -benazepril 5-10 MG capsule Commonly known as: LOTREL Take 1 capsule by mouth daily.  carvedilol  12.5 MG tablet Commonly known as: COREG  Take 12.5 mg by mouth 2 (two) times daily with a meal.   cefdinir  300 MG capsule Commonly known as: OMNICEF  Take 1 capsule (300 mg total) by mouth 2 (two) times daily for 2 days.   fluticasone 50 MCG/ACT nasal spray Commonly known as:  FLONASE Place 2 sprays into both nostrils daily.   gabapentin  300 MG capsule Commonly known as: NEURONTIN  Take 1 capsule (300 mg total) by mouth 3 (three) times daily as needed.   guaiFENesin  100 MG/5ML liquid Commonly known as: ROBITUSSIN Take 5 mLs by mouth every 4 (four) hours as needed for cough or to loosen phlegm.   HYDROcodone -acetaminophen  5-325 MG tablet Commonly known as: NORCO/VICODIN Take 1 tablet by mouth every 6 (six) hours as needed for up to 7 days for severe pain (pain score 7-10) or moderate pain (pain score 4-6).   lidocaine  5 % Commonly known as: LIDODERM  Place 1 patch onto the skin daily. Remove & Discard patch within 12 hours or as directed by MD Start taking on: January 29, 2024   omeprazole  40 MG capsule Commonly known as: PRILOSEC Take 40 mg by mouth daily.   polyethylene glycol powder 17 GM/SCOOP powder Commonly known as: GLYCOLAX /MIRALAX  Take 17 g by mouth daily as needed for moderate constipation.   thiamine  100 MG tablet Commonly known as: VITAMIN B1 Take 1 tablet (100 mg total) by mouth daily. Start taking on: January 29, 2024   tiZANidine  4 MG tablet Commonly known as: ZANAFLEX  Take 1 tablet (4 mg total) by mouth at bedtime as needed for muscle spasms. What changed:  medication strength how much to take when to take this reasons to take this               Durable Medical Equipment  (From admission, onward)           Start     Ordered   01/28/24 1537  For home use only DME lightweight manual wheelchair with seat cushion  Once       Comments: Patient suffers from vertebral compression fractures  which impairs their ability to perform daily activities like bathing in the home.  A walker will not resolve  issue with performing activities of daily living. A wheelchair will allow patient to safely perform daily activities. Patient is not able to propel themselves in the home using a standard weight wheelchair due to general weakness.  Patient can self propel in the lightweight wheelchair. Length of need 6 months . Accessories: elevating leg rests (ELRs), wheel locks, extensions and anti-tippers.   01/28/24 1537   01/28/24 1522  For home use only DME 3 n 1  Once       Comments: Bedside commode. Confine to one room.   01/28/24 1522   01/28/24 1516  For home use only DME oxygen  Once       Question Answer Comment  Length of Need 6 Months   Mode or (Route) Nasal cannula   Liters per Minute 2   Frequency Continuous (stationary and portable oxygen unit needed)   Oxygen conserving device Yes   Oxygen delivery system Gas      01/28/24 1515   01/28/24 1510  For home use only DME Walker rolling  Once       Question Answer Comment  Walker: With 5 Inch Wheels   Patient needs a walker to treat with the following condition Difficulty walking      01/28/24 1510  Subjective:  Evaluated laying in bed with spouse in the room. He is ready to go home. He request some pain medications until he is able to follow up with his pain doc next week.   Discharge Exam: Filed Weights   01/25/24 0500 01/26/24 0500  Weight: 55.6 kg 57.6 kg   General: Chronically ill elderly man laying in bed. No acute distress. HEENT: [place]/AT. Anicteric sclera. CV: RRR. No murmurs, rubs, or gallops. No LE edema Pulmonary: On 2 L [place]. Lungs CTAB. Diminished lung sounds. No wheezing or rales. Abdominal: Soft, nontender, nondistended. Normal bowel sounds. Extremities: Palpable radial and DP pulses. Normal ROM. Skin: Warm and dry. No obvious rash or lesions. Neuro: A&Ox3. Moves all extremities. Normal sensation to light touch. No focal deficit. Psych: Normal mood and affect  Condition at discharge: stable  The results of significant diagnostics from this hospitalization (including imaging, microbiology, ancillary and laboratory) are listed below for reference.   Imaging Studies: CT Angio Chest PE W and/or Wo Contrast Result Date:  01/24/2024 CLINICAL DATA:  Abdominal trauma, blunt; Pulmonary embolism (PE) suspected, high prob. Trauma blunt. obtunded. Chest pain. EXAM: CT ANGIOGRAPHY CHEST CT ABDOMEN AND PELVIS WITH CONTRAST TECHNIQUE: Multidetector CT imaging of the chest was performed using the standard protocol during bolus administration of intravenous contrast. Multiplanar CT image reconstructions and MIPs were obtained to evaluate the vascular anatomy. Multidetector CT imaging of the abdomen and pelvis was performed using the standard protocol during bolus administration of intravenous contrast. RADIATION DOSE REDUCTION: This exam was performed according to the departmental dose-optimization program which includes automated exposure control, adjustment of the mA and/or kV according to patient size and/or use of iterative reconstruction technique. CONTRAST:  75mL OMNIPAQUE  IOHEXOL  350 MG/ML SOLN COMPARISON:  None Available. FINDINGS: CTA CHEST FINDINGS Cardiovascular: Satisfactory opacification of the pulmonary arteries to the segmental level. Limited evaluation of subsegmental level due to motion artifact. No evidence of pulmonary embolism. Normal heart size. No pericardial effusion. Four-vessel coronary calcification. Severe atherosclerotic plaque. Mediastinum/Nodes: No pneumomediastinum. No mediastinal hematoma. The esophagus is unremarkable. The thyroid is unremarkable. The central airways are patent. No mediastinal, hilar, or axillary lymphadenopathy. Lungs/Pleura: Centrilobular emphysematous changes. Right lower lobe peribronchovascular ground-glass and consolidative airspace opacities. No pulmonary nodule. No pulmonary mass. No pulmonary contusion or laceration. No pneumatocele formation. No pleural effusion. No pneumothorax. No hemothorax. Musculoskeletal/Chest wall: No chest wall mass. No acute rib or sternal fracture. Likely acute T9 compression fracture with at least 40% vertebral body height loss. Similar-appearing chronic  T11-T12 compression fractures. Multilevel old healed bilateral rib fractures. ABDOMEN / PELVIS FINDINGS: Hepatobiliary: Borderline enlarged measuring up to 19 cm. Nonspecific subcentimeter hypodensities too small to characterize. No laceration or subcapsular hematoma. Status post cholecystectomy.  No biliary ductal dilatation. Pancreas: Question 2 x 3 hypodense mass of the uncinate process poorly visualized in the setting of motion artifact (3:36). Otherwise normal pancreatic contour. No main pancreatic duct dilatation. Spleen: Not enlarged. No focal lesion. No laceration, subcapsular hematoma, or vascular injury. Adrenals/Urinary Tract: No nodularity bilaterally. Bilateral kidneys enhance symmetrically. No hydronephrosis. No contusion, laceration, or subcapsular hematoma. No injury to the vascular structures or collecting systems. No hydroureter. The urinary bladder is unremarkable. Stomach/Bowel: No small or large bowel wall thickening or dilatation. The appendix is unremarkable. Vasculature/Lymphatics: Severe atherosclerotic plaque. No abdominal aorta or iliac aneurysm. No active contrast extravasation or pseudoaneurysm. No abdominal, pelvic, inguinal lymphadenopathy. Reproductive: Normal. Other: No simple free fluid ascites. No pneumoperitoneum. No hemoperitoneum. No mesenteric hematoma identified. No organized fluid  collection. Musculoskeletal: No significant soft tissue hematoma. Severe degenerative changes of the bilateral hips. No acute pelvic fracture. Please see separately dictated CT lumbar spine 01/24/2024 Other ports and devices: None. Review of the MIP images confirms the above findings. IMPRESSION: 1. No pulmonary embolus with limited evaluation of the subsegmental level. 2. Right lower lobe peribronchovascular ground-glass and consolidative airspace opacities. 3. Likely acute T9 compression fracture with at least 40% vertebral body height loss. 4. No acute intrathoracic, intra-abdominal, intrapelvic  traumatic injury. 5. Similar-appearing chronic T11-T12 compression fractures. 6. Please see separately dictated CT lumbar spine 01/24/2024. 7. Aortic Atherosclerosis (ICD10-I70.0) and Emphysema (ICD10-J43.9). 8. Question 2 x 3 hypodense mass of the uncinate process poorly visualized in the setting of motion artifact. When the patient is clinically stable and able to follow directions and hold their breath (preferably as an outpatient) further evaluation with dedicated MRI pancreatic protocol should be considered. Electronically Signed   By: Morgane  Naveau M.D.   On: 01/24/2024 20:43   CT ABDOMEN PELVIS W CONTRAST Result Date: 01/24/2024 CLINICAL DATA:  Abdominal trauma, blunt; Pulmonary embolism (PE) suspected, high prob. Trauma blunt. obtunded. Chest pain. EXAM: CT ANGIOGRAPHY CHEST CT ABDOMEN AND PELVIS WITH CONTRAST TECHNIQUE: Multidetector CT imaging of the chest was performed using the standard protocol during bolus administration of intravenous contrast. Multiplanar CT image reconstructions and MIPs were obtained to evaluate the vascular anatomy. Multidetector CT imaging of the abdomen and pelvis was performed using the standard protocol during bolus administration of intravenous contrast. RADIATION DOSE REDUCTION: This exam was performed according to the departmental dose-optimization program which includes automated exposure control, adjustment of the mA and/or kV according to patient size and/or use of iterative reconstruction technique. CONTRAST:  75mL OMNIPAQUE  IOHEXOL  350 MG/ML SOLN COMPARISON:  None Available. FINDINGS: CTA CHEST FINDINGS Cardiovascular: Satisfactory opacification of the pulmonary arteries to the segmental level. Limited evaluation of subsegmental level due to motion artifact. No evidence of pulmonary embolism. Normal heart size. No pericardial effusion. Four-vessel coronary calcification. Severe atherosclerotic plaque. Mediastinum/Nodes: No pneumomediastinum. No mediastinal  hematoma. The esophagus is unremarkable. The thyroid is unremarkable. The central airways are patent. No mediastinal, hilar, or axillary lymphadenopathy. Lungs/Pleura: Centrilobular emphysematous changes. Right lower lobe peribronchovascular ground-glass and consolidative airspace opacities. No pulmonary nodule. No pulmonary mass. No pulmonary contusion or laceration. No pneumatocele formation. No pleural effusion. No pneumothorax. No hemothorax. Musculoskeletal/Chest wall: No chest wall mass. No acute rib or sternal fracture. Likely acute T9 compression fracture with at least 40% vertebral body height loss. Similar-appearing chronic T11-T12 compression fractures. Multilevel old healed bilateral rib fractures. ABDOMEN / PELVIS FINDINGS: Hepatobiliary: Borderline enlarged measuring up to 19 cm. Nonspecific subcentimeter hypodensities too small to characterize. No laceration or subcapsular hematoma. Status post cholecystectomy.  No biliary ductal dilatation. Pancreas: Question 2 x 3 hypodense mass of the uncinate process poorly visualized in the setting of motion artifact (3:36). Otherwise normal pancreatic contour. No main pancreatic duct dilatation. Spleen: Not enlarged. No focal lesion. No laceration, subcapsular hematoma, or vascular injury. Adrenals/Urinary Tract: No nodularity bilaterally. Bilateral kidneys enhance symmetrically. No hydronephrosis. No contusion, laceration, or subcapsular hematoma. No injury to the vascular structures or collecting systems. No hydroureter. The urinary bladder is unremarkable. Stomach/Bowel: No small or large bowel wall thickening or dilatation. The appendix is unremarkable. Vasculature/Lymphatics: Severe atherosclerotic plaque. No abdominal aorta or iliac aneurysm. No active contrast extravasation or pseudoaneurysm. No abdominal, pelvic, inguinal lymphadenopathy. Reproductive: Normal. Other: No simple free fluid ascites. No pneumoperitoneum. No hemoperitoneum. No mesenteric  hematoma identified.  No organized fluid collection. Musculoskeletal: No significant soft tissue hematoma. Severe degenerative changes of the bilateral hips. No acute pelvic fracture. Please see separately dictated CT lumbar spine 01/24/2024 Other ports and devices: None. Review of the MIP images confirms the above findings. IMPRESSION: 1. No pulmonary embolus with limited evaluation of the subsegmental level. 2. Right lower lobe peribronchovascular ground-glass and consolidative airspace opacities. 3. Likely acute T9 compression fracture with at least 40% vertebral body height loss. 4. No acute intrathoracic, intra-abdominal, intrapelvic traumatic injury. 5. Similar-appearing chronic T11-T12 compression fractures. 6. Please see separately dictated CT lumbar spine 01/24/2024. 7. Aortic Atherosclerosis (ICD10-I70.0) and Emphysema (ICD10-J43.9). 8. Question 2 x 3 hypodense mass of the uncinate process poorly visualized in the setting of motion artifact. When the patient is clinically stable and able to follow directions and hold their breath (preferably as an outpatient) further evaluation with dedicated MRI pancreatic protocol should be considered. Electronically Signed   By: Morgane  Naveau M.D.   On: 01/24/2024 20:43   CT L-SPINE NO CHARGE Result Date: 01/24/2024 CLINICAL DATA:  Trauma blunt. obtunded EXAM: CT LUMBAR SPINE WITHOUT CONTRAST TECHNIQUE: Multidetector CT imaging of the lumbar spine was performed without intravenous contrast administration. Multiplanar CT image reconstructions were also generated. RADIATION DOSE REDUCTION: This exam was performed according to the departmental dose-optimization program which includes automated exposure control, adjustment of the mA and/or kV according to patient size and/or use of iterative reconstruction technique. COMPARISON:  MRI lumbar spine 12/23/2023, ct lumbar 10/30/20 FINDINGS: Segmentation: 5 lumbar type vertebrae. Alignment: Similar straightening of the lumbar  spine. Vertebrae: Similar-appearing T11 and T12 anterior wedge compression fracture. Similar-appearing moderate L1 compression fracture. Similar-appearing severe L2 and L4 compression fractures. Severe multilevel degenerative changes of the spine. No definite acute fracture or focal pathologic process. Paraspinal and other soft tissues: Negative. Disc levels: Multilevel intervertebral disc space vacuum phenomenon. Other: Severe atherosclerotic plaque. Bilateral hip degenerative changes. Please see separately dictated CT chest abdomen pelvis 01/24/2024 IMPRESSION: 1. No acute displaced fracture or traumatic listhesis of the lumbar spine. 2. Similar-appearing T11 and T12 anterior wedge compression fracture. Similar-appearing moderate L1 compression fracture. Similar-appearing severe L2 and L4 compression fractures. Correlate with point tenderness to palpation to evaluate for an acute component. Electronically Signed   By: Morgane  Naveau M.D.   On: 01/24/2024 20:29   CT Head Wo Contrast Result Date: 01/24/2024 CLINICAL DATA:  Head trauma, moderate-severe; Neck trauma, intoxicated or obtunded (Age >= 16y) EXAM: CT HEAD WITHOUT CONTRAST CT CERVICAL SPINE WITHOUT CONTRAST TECHNIQUE: Multidetector CT imaging of the head and cervical spine was performed following the standard protocol without intravenous contrast. Multiplanar CT image reconstructions of the cervical spine were also generated. RADIATION DOSE REDUCTION: This exam was performed according to the departmental dose-optimization program which includes automated exposure control, adjustment of the mA and/or kV according to patient size and/or use of iterative reconstruction technique. COMPARISON:  None Available. FINDINGS: CT HEAD FINDINGS Brain: No evidence of large-territorial acute infarction. No parenchymal hemorrhage. No mass lesion. No extra-axial collection. No mass effect or midline shift. No hydrocephalus. Basilar cisterns are patent. Vascular: No  hyperdense vessel. Atherosclerotic calcifications are present within the cavernous internal carotid and vertebral arteries. Skull: No acute fracture or focal lesion. Sinuses/Orbits: Paranasal sinuses and mastoid air cells are clear. The orbits are unremarkable. Other: None. CT CERVICAL SPINE FINDINGS Alignment: Normal. Skull base and vertebrae: Multilevel moderate degenerative changes of the spine. No severe osseous neural foraminal or central canal stenosis. No acute fracture. No aggressive  appearing focal osseous lesion or focal pathologic process. Soft tissues and spinal canal: No prevertebral fluid or swelling. No visible canal hematoma. Upper chest: Centrilobular emphysematous changes. Other: Atherosclerotic plaque of the aortic arch and its main branches. IMPRESSION: 1. No acute intracranial abnormality. 2. No acute displaced fracture or traumatic listhesis of the cervical spine. 3. Aortic Atherosclerosis (ICD10-I70.0) and Emphysema (ICD10-J43.9). Electronically Signed   By: Morgane  Naveau M.D.   On: 01/24/2024 20:13   CT Cervical Spine Wo Contrast Result Date: 01/24/2024 CLINICAL DATA:  Head trauma, moderate-severe; Neck trauma, intoxicated or obtunded (Age >= 16y) EXAM: CT HEAD WITHOUT CONTRAST CT CERVICAL SPINE WITHOUT CONTRAST TECHNIQUE: Multidetector CT imaging of the head and cervical spine was performed following the standard protocol without intravenous contrast. Multiplanar CT image reconstructions of the cervical spine were also generated. RADIATION DOSE REDUCTION: This exam was performed according to the departmental dose-optimization program which includes automated exposure control, adjustment of the mA and/or kV according to patient size and/or use of iterative reconstruction technique. COMPARISON:  None Available. FINDINGS: CT HEAD FINDINGS Brain: No evidence of large-territorial acute infarction. No parenchymal hemorrhage. No mass lesion. No extra-axial collection. No mass effect or midline  shift. No hydrocephalus. Basilar cisterns are patent. Vascular: No hyperdense vessel. Atherosclerotic calcifications are present within the cavernous internal carotid and vertebral arteries. Skull: No acute fracture or focal lesion. Sinuses/Orbits: Paranasal sinuses and mastoid air cells are clear. The orbits are unremarkable. Other: None. CT CERVICAL SPINE FINDINGS Alignment: Normal. Skull base and vertebrae: Multilevel moderate degenerative changes of the spine. No severe osseous neural foraminal or central canal stenosis. No acute fracture. No aggressive appearing focal osseous lesion or focal pathologic process. Soft tissues and spinal canal: No prevertebral fluid or swelling. No visible canal hematoma. Upper chest: Centrilobular emphysematous changes. Other: Atherosclerotic plaque of the aortic arch and its main branches. IMPRESSION: 1. No acute intracranial abnormality. 2. No acute displaced fracture or traumatic listhesis of the cervical spine. 3. Aortic Atherosclerosis (ICD10-I70.0) and Emphysema (ICD10-J43.9). Electronically Signed   By: Morgane  Naveau M.D.   On: 01/24/2024 20:13   DG Pelvis Portable Result Date: 01/24/2024 CLINICAL DATA:  chest pain, fall EXAM: PORTABLE PELVIS 1-2 VIEWS COMPARISON:  CT abdomen pelvis 01/24/2024 FINDINGS: There is no evidence of pelvic fracture or diastasis. Moderate to severe degenerative changes of bilateral hips. No pelvic bone lesions are seen. Atherosclerotic plaque. IMPRESSION: Negative for acute traumatic injury. Electronically Signed   By: Morgane  Naveau M.D.   On: 01/24/2024 20:10   DG Chest Portable 1 View Result Date: 01/24/2024 CLINICAL DATA:  chest pain, fall EXAM: PORTABLE CHEST 1 VIEW COMPARISON:  CT angiography chest 01/24/2024 FINDINGS: The heart and mediastinal contours are within normal limits. Atherosclerotic plaque. Emphysematous changes. Right lower lung zone airspace and interstitial opacities. No pulmonary edema. No pleural effusion. No  pneumothorax. No acute osseous abnormality.  Old healed bilateral rib fractures. IMPRESSION: 1. Right lower lung zone airspace and interstitial opacities consistent with infection/inflammation. Followup PA and lateral chest X-ray is recommended in 3-4 weeks following therapy to ensure resolution. 2. Aortic Atherosclerosis (ICD10-I70.0) and Emphysema (ICD10-J43.9). Electronically Signed   By: Morgane  Naveau M.D.   On: 01/24/2024 20:08   DG Bone Density Result Date: 01/12/2024 Table formatting from the original result was not included. Date of study: 01/11/2024 Exam: DUAL X-RAY ABSORPTIOMETRY (DXA) FOR BONE MINERAL DENSITY (BMD) Instrument: [employer] Requesting Provider: Dr. [name] Indication: screening for osteoporosis in a patient with vertebral compression fracture Comparison: none (  please note that it is not possible to compare data from different instruments) Clinical data: Pt is a 78 y.o. male with history of compression fracture of L5 vertebra.  On calcium and vitamin D. Results:  Lumbar spine L1-L4 Femoral neck (FN) 33% distal radius Ultra distal radius T-score N/a RFN: -3.6 LFN: -3.8 -1.9 +0.6 Assessment: Patient has OSTEOPOROSIS according to the [facility] classification for osteoporosis (see below). Fracture risk: high Comments: the technical quality of the study is good, however, the lumbar spine area could not be analyzed due to previous compression fracture and degenerative changes. Ultradistal radius can be used as a surrogate site for BMD analysis of trabecular bone instead of the spine. Evaluation for secondary causes should be considered if clinically indicated. Recommend optimizing calcium (1200 mg/day) and vitamin D (800 IU/day). Treatment is indicated. Followup: Repeat BMD is appropriate after 2 years or after 1-2 years if starting treatment. WHO criteria for diagnosis of osteoporosis in postmenopausal women and in men 28 y/o or older: - normal: T-score -1.0 to + 1.0 - osteopenia/low bone  density: T-score between -2.5 and -1.0 - osteoporosis: T-score below -2.5 - severe osteoporosis: T-score below -2.5 with history of fragility fracture Note: although not part of the WHO classification, the presence of a fragility fracture, regardless of the T-score, should be considered diagnostic of osteoporosis, provided other causes for the fracture have been excluded. Treatment: The National Osteoporosis Foundation recommends that treatment be considered in postmenopausal women and men age 55 or older with: 1. Hip or vertebral (clinical or morphometric) fracture 2. T-score of - 2.5 or lower at the spine or hip 3. 10-year fracture probability by FRAX of at least 20% for a major osteoporotic fracture and 3% for a hip fracture Emilie Harden, MD [facility]   Microbiology: Results for orders placed or performed during the hospital encounter of 01/24/24  MRSA Next Gen by PCR, Nasal     Status: None   Collection Time: 01/24/24  7:57 PM   Specimen: Nasal Mucosa; Nasal Swab  Result Value Ref Range Status   MRSA by PCR Next Gen NOT DETECTED NOT DETECTED Final    Comment: (NOTE) The GeneXpert MRSA Assay (FDA approved for NASAL specimens only), is one component of a comprehensive MRSA colonization surveillance program. It is not intended to diagnose MRSA infection nor to guide or monitor treatment for MRSA infections. Test performance is not FDA approved in patients less than 77 years old. Performed at [facility], [address] [place], [place] [address]     Labs: CBC: Recent Labs  Lab 01/24/24 1617 01/26/24 0717 01/27/24 0710  WBC 17.8* 16.3* 12.1*  NEUTROABS 16.2* 14.2*  --   HGB 14.6 14.6 14.5  HCT 39.3 41.2 42.2  MCV 94.2 98.1 99.8  PLT 192 197 168   Basic Metabolic Panel: Recent Labs  Lab 01/25/24 1238 01/25/24 1821 01/26/24 0015 01/26/24 0717 01/27/24 0710  NA 123* 125* 127* 128* 130*  K 4.1 4.4 4.1 4.4 4.6  CL 77* 80* 83* 81* 84*  CO2 35* 34* 35*  32 37*  GLUCOSE 84 102* 132* 101* 84  BUN <5* <5* <5* <5* 6*  CREATININE 0.47* 0.44* 0.38* 0.34* 0.41*  CALCIUM 8.9 8.5* 8.5* 8.7* 8.7*  MG  --   --   --  1.7 1.7  PHOS  --   --   --  2.3* 3.7   Liver Function Tests: Recent Labs  Lab 01/24/24 1617 01/27/24 0710  AST 36 27  ALT 16 15  ALKPHOS 114 86  BILITOT 1.2 1.0  PROT 6.6 6.1*  ALBUMIN 3.2* 2.8*   CBG: Recent Labs  Lab 01/25/24 0342 01/25/24 0752 01/25/24 1121 01/25/24 1524 01/25/24 1950  GLUCAP 94 98 73 89 134*    Discharge time spent: greater than 30 minutes.  Signed: Vita Grip, MD [facility] 01/28/2024

## 2024-01-28 NOTE — Progress Notes (Signed)
    Durable Medical Equipment  (From admission, onward)           Start     Ordered   01/28/24 1537  For home use only DME lightweight manual wheelchair with seat cushion  Once       Comments: Patient suffers from vertebral compression fractures  which impairs their ability to perform daily activities like bathing in the home.  A walker will not resolve  issue with performing activities of daily living. A wheelchair will allow patient to safely perform daily activities. Patient is not able to propel themselves in the home using a standard weight wheelchair due to general weakness. Patient can self propel in the lightweight wheelchair. Length of need 6 months . Accessories: elevating leg rests (ELRs), wheel locks, extensions and anti-tippers.   01/28/24 1537   01/28/24 1522  For home use only DME 3 n 1  Once       Comments: Bedside commode. Confine to one room.   01/28/24 1522   01/28/24 1516  For home use only DME oxygen  Once       Question Answer Comment  Length of Need 6 Months   Mode or (Route) Nasal cannula   Liters per Minute 2   Frequency Continuous (stationary and portable oxygen unit needed)   Oxygen conserving device Yes   Oxygen delivery system Gas      01/28/24 1515   01/28/24 1510  For home use only DME Walker rolling  Once       Question Answer Comment  Walker: With 5 Inch Wheels   Patient needs a walker to treat with the following condition Difficulty walking      01/28/24 1510

## 2024-01-28 NOTE — Progress Notes (Signed)
 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 Also has an alcohol history.    PT Comments  Patient resting in bed at start of session and spouse present at bedside. Pt agreeable to therapy visit for gait and stair training. Pt completed bed mobility at supervision level with use of bed features and extra time. Pt able to transfer sit<>stand to RW form EOB with CGA, cues to use bil UE to power up. Pt amb ~75' with RW and cues needed to manage walker proximity as trunk flexed and pt tending to advance too far anteriorly. Pt fatigued quickly taking 2 standing rest breaks and noted to desaturate on RA dropping to 86%. 2L/min applied and pt saturations recovered to 93%. Pt dependently transported to gym for stair training and completed 3 step negotiation with bil HR support and side step technique. Pt then completed single curb negotiation to simulate entrance into garage/man-cave. Pt's spouse present and educated on safe guarding position and verbalized understanding of use of gait belt for safety. EOS pt with OT for therapy visit. Will continue to progress pt as able during acute stay.    If plan is discharge home, recommend the following: A lot of help with walking and/or transfers;A lot of help with bathing/dressing/bathroom   Can travel by private vehicle        Equipment Recommendations  Rolling walker (2 wheels);BSC/3in1;Wheelchair (measurements PT)    Recommendations for Other Services OT consult     Precautions / Restrictions Precautions Precautions: Fall Recall of Precautions/Restrictions: Intact Restrictions Weight Bearing Restrictions Per Provider Order: No     Mobility   Bed Mobility Overal bed mobility: Needs Assistance Bed Mobility: Supine to Sit     Supine to sit: Supervision, HOB elevated     General bed mobility comments: extra time and HOB elevated    Transfers Overall transfer level: Needs assistance Equipment used: Rolling walker (2 wheels) Transfers: Sit to/from Stand Sit to Stand: Contact guard assist           General transfer comment: cues for bil UE use to power up, CGA for safety    Ambulation/Gait Ambulation/Gait assistance: Contact guard assist, Supervision Gait Distance (Feet): 75 Feet Assistive device: Rolling walker (2 wheels) Gait Pattern/deviations: Step-through pattern, Decreased stride length, Trunk flexed, Wide base of support, Shuffle Gait velocity: decr     General Gait Details: head and neck flexion and trunk flexed, pt advacning walker too far ahead, cues and assist for positioning closer to RW, Pt c/o slight dizziness, SpO2 desat to 86% On RA and recovered to 93% on 2L/min.   Stairs Stairs: Yes Stairs assistance: Contact guard assist Stair Management: One rail Left, Step to pattern, Sideways, With walker, Forwards Number of Stairs: 3 (1) General stair comments: cues for side step technique with 3 step negotitaion bil HR support, CGA for safety. pt perofrmed curb negotiation with RW and forward technique with CGA for safety. spouse present for education on guarding.   Wheelchair Mobility     Tilt Bed    Modified Rankin (Stroke Patients Only)       Balance Overall balance assessment: Needs assistance Sitting-balance support: Feet supported Sitting balance-Leahy Scale: Fair     Standing balance  support: Bilateral upper extremity supported, Reliant on assistive device for balance Standing balance-Leahy Scale: Poor Standing balance comment: UE support or assist for balance                            Communication Communication Communication: No apparent difficulties  Cognition  Arousal: Alert Behavior During Therapy: WFL for tasks assessed/performed   PT - Cognitive impairments: No apparent impairments                         Following commands: Intact      Cueing Cueing Techniques: Verbal cues, Tactile cues  Exercises      General Comments General comments (skin integrity, edema, etc.): Pt received from pt on 2L O2, requiring O2 to mobilize in home      Pertinent Vitals/Pain Pain Assessment Pain Assessment: Faces Faces Pain Scale: Hurts even more Pain Location: back & rib pain Pain Descriptors / Indicators: Discomfort, Guarding, Grimacing, Moaning Pain Intervention(s): Monitored during session, Limited activity within patient's tolerance, Repositioned    Home Living                          Prior Function            PT Goals (current goals can now be found in the care plan section) Acute Rehab PT Goals Patient Stated Goal: get home PT Goal Formulation: With patient/family Time For Goal Achievement: 02/09/24 Potential to Achieve Goals: Fair Progress towards PT goals: Progressing toward goals    Frequency    Min 2X/week      PT Plan      Co-evaluation              AM-PAC PT "6 Clicks" Mobility   Outcome Measure  Help needed turning from your back to your side while in a flat bed without using bedrails?: A Little Help needed moving from lying on your back to sitting on the side of a flat bed without using bedrails?: A Little Help needed moving to and from a bed to a chair (including a wheelchair)?: A Little Help needed standing up from a chair using your arms (e.g., wheelchair or bedside chair)?: A Little Help needed to walk in hospital room?: A Little Help needed climbing 3-5 steps with a railing? : A Little 6 Click Score: 18    End of Session Equipment Utilized During Treatment: Gait belt;Oxygen Activity Tolerance: Patient tolerated treatment well Patient left: in chair;with call bell/phone within  reach;with chair alarm set Nurse Communication: Mobility status PT Visit Diagnosis: Unsteadiness on feet (R26.81);Other abnormalities of gait and mobility (R26.89);History of falling (Z91.81);Muscle weakness (generalized) (M62.81);Adult, failure to thrive (R62.7)     Time: 1427-1508 PT Time Calculation (min) (ACUTE ONLY): 41 min  Charges:    $Gait Training: 23-37 mins $Therapeutic Activity: 8-22 mins PT General Charges $$ ACUTE PT VISIT: 1 Visit                     Tish Forge, DPT Acute Rehabilitation Services Office [phone]  01/28/24 3:22 PM

## 2024-01-28 NOTE — Progress Notes (Signed)
 SATURATION QUALIFICATIONS: (This note is used to comply with regulatory documentation for home oxygen)  Patient Saturations on Room Air at Rest = 90%  Patient Saturations on Room Air while Ambulating = 86%  Patient Saturations on 2 Liters of oxygen while Ambulating = 93%  Please briefly explain why patient needs home oxygen: Pt is unable to maintain adequate O2 saturation levels on RA with mobility. He requires 2L/min to maintain >92% SpO2.    Tish Forge, DPT Acute Rehabilitation Services Office [phone]  01/28/24 3:12 PM

## 2024-01-28 NOTE — TOC Transition Note (Signed)
 Transition of Care [facility]) - Discharge Note   Patient Details  Name: Dustin Park MRN: [id number] Date of Birth: 17-Apr-1946  Transition of Care [facility]) CM/SW Contact:  Alisa App, RN Phone Number: 01/28/2024, 3:54 PM   Clinical Narrative:    Patient will DC [id number] Anticipated DC date: 01/28/2024 Family notified: yes Transport by: car   Per MD patient ready for DC today .RN, patient, and patient's wife.notified of DC.  DME and home health orders noted. Pt agreeable. Pt without provider preference. Referral made with Cory[facility] and accepted.Aaron Aas Referral made with Zach[facility] for DME, RW,BSC and W/C.Equipment will be delivered to bedside prior to d/c. Post hospital f/u noted on AVS. Wife to provide transportation to home.  RNCM will sign off for now as intervention is no longer needed. Please consult us  again if new needs arise.    Final next level of care: Home w Home Health Services Barriers to Discharge: Barriers Resolved   Patient Goals and CMS Choice     Choice offered to / list presented to : Patient      Discharge Placement                       Discharge Plan and Services Additional resources added to the After Visit Summary for                  DME Arranged: Walker rolling, Bedside commode, oxygen DME Agency: [facility] Date DME Agency Contacted: 01/28/24 Time DME Agency Contacted: 1553 Representative spoke with at DME Agency: Gladys Lamp HH Arranged: PT, OT, Speech Therapy HH Agency: [facility] Date [facility] Agency Contacted: 01/28/24 Time HH Agency Contacted: 1553 Representative spoke with at [facility] Agency: Randel Buss  Social Drivers of Health (SDOH) Interventions SDOH Screenings   Food Insecurity: Patient Unable To Answer (01/25/2024)  Housing: Patient Unable To Answer (01/25/2024)  Transportation Needs: Patient Unable To Answer (01/25/2024)  Utilities: Patient Unable To Answer (01/25/2024)  Financial Resource Strain: Medium  Risk (11/17/2023)   Received from [facility]  Physical Activity: Unknown (11/17/2023)   Received from [facility]  Social Connections: Patient Unable To Answer (01/25/2024)  Stress: Stress Concern Present (11/17/2023)   Received from [facility]  Tobacco Use: High Risk (01/05/2024)     Readmission Risk Interventions     No data to display

## 2024-01-28 NOTE — Progress Notes (Signed)
 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 Also has an alcohol history.   OT comments  Pt progressing well towards goals. OT received pt from PT. Session focused on education and managing O2 in home. Shower transfers demo & simulated in room with CGA. Educated on use of O2 in home, and managing line. Pt upset when informed that he is not allowed to smoke while on O2 d/t fire hazard. OT educated pt on the importance of the O2, and properly adhering to its use in the home otherwise pt is at significant risk for falls in home. Continue to recommend HHOT to optimize independence levels. Will continue to follow acutely.      If plan is discharge home, recommend the following:  A little help with walking and/or transfers;A little help with bathing/dressing/bathroom;Assistance with cooking/housework;Supervision due to cognitive status   Equipment Recommendations  BSC/3in1    Recommendations for Other Services      Precautions / Restrictions Precautions Precautions: Fall Recall of Precautions/Restrictions: Intact Restrictions Weight Bearing Restrictions Per Provider Order: No       Mobility Bed Mobility      Transfers Overall transfer level: Needs assistance Equipment used: Rolling walker (2 wheels) Transfers: Sit to/from Stand, Bed to chair/wheelchair/BSC Sit to Stand: Contact guard assist     Step pivot transfers: Contact guard assist     General transfer comment: Increased time pt able to complete requiring rest breaks     Balance Overall balance assessment: Needs assistance Sitting-balance support: Feet supported Sitting balance-Leahy Scale: Fair      Standing balance support: Bilateral upper extremity supported, Reliant on assistive device for balance Standing balance-Leahy Scale: Poor Standing balance comment: UE support or assist for balance       ADL either performed or assessed with clinical judgement   ADL Overall ADL's : Needs assistance/impaired       Tub/ Shower Transfer: Walk-in shower;Shower Field seismologist Details (indicate cue type and reason): Simulated in room Functional mobility during ADLs: Contact guard assist;Rolling walker (2 wheels) General ADL Comments: Pt with significantly decreased activity tolerance    Extremity/Trunk Assessment Upper Extremity Assessment Upper Extremity Assessment: Generalized weakness;Overall [facility] for tasks assessed   Lower Extremity Assessment Lower Extremity Assessment: Defer to PT evaluation        Vision   Vision Assessment?: No apparent visual deficits         Communication Communication Communication: No apparent difficulties   Cognition Arousal: Alert Behavior During Therapy: WFL for tasks assessed/performed Cognition: Cognition impaired     Awareness: Intellectual awareness intact, Online awareness impaired     Executive functioning impairment (select all impairments): Reasoning OT - Cognition Comments: Poor problem solving and safety awareness.       Following commands: Intact        Cueing   Cueing Techniques: Verbal cues, Tactile cues        General Comments Pt received from pt on 2L O2, requiring O2 to mobilize in home    Pertinent Vitals/ Pain       Pain Assessment Pain Assessment: Faces Faces Pain Scale: Hurts even more Pain Location: back & rib pain Pain Descriptors / Indicators: Discomfort, Guarding, Grimacing, Moaning Pain Intervention(s):  Monitored during session, Repositioned   Frequency  Min 2X/week        Progress Toward Goals  OT Goals(current goals can now be found in the care plan section)  Progress towards OT  goals: Progressing toward goals  Acute Rehab OT Goals Patient Stated Goal: To go home OT Goal Formulation: With patient Time For Goal Achievement: 02/10/24 Potential to Achieve Goals: Good ADL Goals Pt Will Perform Lower Body Dressing: with supervision;sit to/from stand Pt Will Transfer to Toilet: with supervision;ambulating;bedside commode Pt Will Perform Toileting - Clothing Manipulation and hygiene: with supervision;sit to/from stand Additional ADL Goal #1: Pt will complete standing ADL tasks for >5 minutes with s for safety to increase activity tolerance  Plan         AM-PAC OT "6 Clicks" Daily Activity     Outcome Measure   Help from another person eating meals?: None Help from another person taking care of personal grooming?: A Little Help from another person toileting, which includes using toliet, bedpan, or urinal?: A Little Help from another person bathing (including washing, rinsing, drying)?: A Little Help from another person to put on and taking off regular upper body clothing?: A Little Help from another person to put on and taking off regular lower body clothing?: A Little 6 Click Score: 19    End of Session Equipment Utilized During Treatment: Gait belt;Rolling walker (2 wheels)  OT Visit Diagnosis: Unsteadiness on feet (R26.81);Other abnormalities of gait and mobility (R26.89);Repeated falls (R29.6);History of falling (Z91.81);Muscle weakness (generalized) (M62.81);Adult, failure to thrive (R62.7)   Activity Tolerance Patient limited by fatigue;Patient limited by pain   Patient Left in chair;with call bell/phone within reach;with chair alarm set;with family/visitor present   Nurse Communication Mobility status        Time: 6045-4098 OT Time Calculation (min): 41 min  Charges: OT General Charges $OT Visit: 1 Visit OT Treatments $Self Care/Home Management : 23-37 mins  Delmer Ferraris, OT  Acute Rehabilitation Services Office [phone] Secure chat  preferred   Mickael Alamo 01/28/2024, 4:43 PM

## 2024-02-01 ENCOUNTER — Ambulatory Visit: Admitting: Family Medicine

## 2024-02-01 ENCOUNTER — Ambulatory Visit (INDEPENDENT_AMBULATORY_CARE_PROVIDER_SITE_OTHER)

## 2024-02-01 VITALS — BP 102/64 | HR 76 | Ht 71.0 in | Wt 129.0 lb

## 2024-02-01 DIAGNOSIS — E871 Hypo-osmolality and hyponatremia: Secondary | ICD-10-CM

## 2024-02-01 DIAGNOSIS — E876 Hypokalemia: Secondary | ICD-10-CM

## 2024-02-01 DIAGNOSIS — M545 Low back pain, unspecified: Secondary | ICD-10-CM

## 2024-02-01 DIAGNOSIS — M4856XA Collapsed vertebra, not elsewhere classified, lumbar region, initial encounter for fracture: Secondary | ICD-10-CM

## 2024-02-01 DIAGNOSIS — M48061 Spinal stenosis, lumbar region without neurogenic claudication: Secondary | ICD-10-CM

## 2024-02-01 DIAGNOSIS — D72829 Elevated white blood cell count, unspecified: Secondary | ICD-10-CM

## 2024-02-01 DIAGNOSIS — K8689 Other specified diseases of pancreas: Secondary | ICD-10-CM

## 2024-02-01 DIAGNOSIS — J189 Pneumonia, unspecified organism: Secondary | ICD-10-CM

## 2024-02-01 DIAGNOSIS — G8929 Other chronic pain: Secondary | ICD-10-CM

## 2024-02-01 DIAGNOSIS — E43 Unspecified severe protein-calorie malnutrition: Secondary | ICD-10-CM

## 2024-02-01 LAB — COMPREHENSIVE METABOLIC PANEL WITH GFR
ALT: 17 U/L (ref 0–53)
AST: 26 U/L (ref 0–37)
Albumin: 3.7 g/dL (ref 3.5–5.2)
Alkaline Phosphatase: 95 U/L (ref 39–117)
BUN: 8 mg/dL (ref 6–23)
CO2: 38 meq/L — ABNORMAL HIGH (ref 19–32)
Calcium: 9.2 mg/dL (ref 8.4–10.5)
Chloride: 90 meq/L — ABNORMAL LOW (ref 96–112)
Creatinine, Ser: 0.33 mg/dL — ABNORMAL LOW (ref 0.40–1.50)
GFR: 113.18 mL/min (ref >60.00)
Glucose, Bld: 82 mg/dL (ref 70–99)
Potassium: 4 meq/L (ref 3.5–5.1)
Sodium: 134 meq/L — ABNORMAL LOW (ref 135–145)
Total Bilirubin: 0.6 mg/dL (ref 0.2–1.2)
Total Protein: 6.8 g/dL (ref 6.0–8.3)

## 2024-02-01 LAB — CBC WITH DIFFERENTIAL/PLATELET
Basophils Absolute: 0.1 10*3/uL (ref 0.0–0.1)
Basophils Relative: 1 % (ref 0.0–3.0)
Eosinophils Absolute: 0.2 10*3/uL (ref 0.0–0.7)
Eosinophils Relative: 2.2 % (ref 0.0–5.0)
HCT: 39.1 % (ref 39.0–52.0)
Hemoglobin: 13.2 g/dL (ref 13.0–17.0)
Lymphocytes Relative: 12.2 % (ref 12.0–46.0)
Lymphs Abs: 0.9 10*3/uL (ref 0.7–4.0)
MCHC: 33.6 g/dL (ref 30.0–36.0)
MCV: 103 fl — ABNORMAL HIGH (ref 78.0–100.0)
Monocytes Absolute: 0.7 10*3/uL (ref 0.1–1.0)
Monocytes Relative: 9.5 % (ref 3.0–12.0)
Neutro Abs: 5.4 10*3/uL (ref 1.4–7.7)
Neutrophils Relative %: 75.1 % (ref 43.0–77.0)
Platelets: 233 10*3/uL (ref 150.0–400.0)
RBC: 3.8 Mil/uL — ABNORMAL LOW (ref 4.22–5.81)
RDW: 12.6 % (ref 11.5–15.5)
WBC: 7.2 10*3/uL (ref 4.0–10.5)

## 2024-02-01 NOTE — Progress Notes (Unsigned)
   Dustin Muck, PhD, LAT, ATC acting as a scribe for Dustin Juniper, MD.  Dustin Park is a 78 y.o. male who presents to [facility] today for f/u LBP w/ compression fx and DEXA scan review. Pt was last seen by Dr. [name] on 01/05/24 and he was referred to pain management, and a DEXA scan and lumbar ESI were ordered.   Today, pt reports he passed out while in the garage and his wife had to call 911. He is in constant pain in his back. He is scheduled to be seen at [facility] tomorrow for is pain management.    He has multiple issues chronically and acutely.  Resulting in his hospitalization he was found to have significant hyponatremia and other metabolic derangements.  Sodium was as low as 115.  Pain has been difficult to control and he was prescribed and discharged with hydrocodone  and is seeing [name] tomorrow.  Additionally there is a possible 2 x 3 cm hypodense mass associated with the pancreas that is not well-visualized due to motion artifact.  Radiology recommended an MRI with pancreatic protocol in the outpatient setting.  Patient does not yet have an appointment with his PCP for hospital follow-up.  Dx testing: 01/24/24 L-spine CT 01/11/24 DEXA 12/23/23 L-spine MRI  11/30/23 L-spine XR 10/30/20 L-spine CT             09/14/20 L-spine MRI             09/05/20 L-spine XR  Pertinent review of systems: No fevers or chills.  Positive for pain and cough and congestion and decreased appetite.  He notes since hospitalization his appetite has returned and he is eating again.  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   Exam:  BP 102/64   Pulse 76   Ht 5\' 11"  (1.803 m)   Wt 129 lb (58.5 kg) Comment: Pt wheelchair bound  SpO2 95%   BMI 17.99 kg/m   Wt Readings from Last 10 Encounters:  02/01/24 129 lb  (58.5 kg)  01/26/24 126 lb 15.8 oz (57.6 kg)  09/25/20 153 lb 12.8 oz (69.8 kg)  09/05/20 153 lb 6.4 oz (69.6 kg)    General: Well Developed, well nourished, and in no acute distress.   MSK: ***    Lab and Radiology Results No results found for this or any previous visit (from the past 72 hours). No results found.     Assessment and Plan: 78 y.o. male with ***   PDMP not reviewed this encounter. Orders Placed This Encounter  Procedures   DG Chest 2 View    Standing Status:   Future    Number of Occurrences:   1    Expiration Date:   01/31/2025    Reason for Exam (SYMPTOM  OR DIAGNOSIS REQUIRED):   eval PNA    Preferred imaging location?:   [facility]   Comprehensive metabolic panel with GFR    Standing Status:   Future    Number of Occurrences:   1    Expiration Date:   01/31/2025   CBC w/Diff   No orders of the defined types were placed in this encounter.    Discussed warning signs or symptoms. Please see discharge instructions. Patient expresses understanding.   ***

## 2024-02-01 NOTE — Patient Instructions (Addendum)
 Thank you for coming in today.   Please get an Xray today before you leave   Please get labs today before you leave   Call your primary care provider

## 2024-02-02 ENCOUNTER — Ambulatory Visit: Admitting: Family Medicine

## 2024-02-02 NOTE — Progress Notes (Signed)
 Chest x-ray shows emphysema which we saw on the CT scan.  It also shows a little bit of fluid around the base of the lungs.  No new pneumonia.  This is about what I expected.  Additionally has some old rib fractures that they can see.  Will look at this again when you return next week.

## 2024-02-02 NOTE — Progress Notes (Signed)
 Labs are stable yesterday from hospital discharge.  No severe abnormalities identified on your labs.

## 2024-02-09 ENCOUNTER — Other Ambulatory Visit: Payer: Self-pay

## 2024-02-09 ENCOUNTER — Ambulatory Visit (INDEPENDENT_AMBULATORY_CARE_PROVIDER_SITE_OTHER)

## 2024-02-09 ENCOUNTER — Ambulatory Visit: Admitting: Family Medicine

## 2024-02-09 VITALS — BP 142/72 | HR 70 | Ht 71.0 in

## 2024-02-09 DIAGNOSIS — M4856XA Collapsed vertebra, not elsewhere classified, lumbar region, initial encounter for fracture: Secondary | ICD-10-CM | POA: Diagnosis not present

## 2024-02-09 DIAGNOSIS — M48061 Spinal stenosis, lumbar region without neurogenic claudication: Secondary | ICD-10-CM

## 2024-02-09 DIAGNOSIS — E876 Hypokalemia: Secondary | ICD-10-CM | POA: Diagnosis not present

## 2024-02-09 DIAGNOSIS — E871 Hypo-osmolality and hyponatremia: Secondary | ICD-10-CM

## 2024-02-09 DIAGNOSIS — G8929 Other chronic pain: Secondary | ICD-10-CM

## 2024-02-09 DIAGNOSIS — D72829 Elevated white blood cell count, unspecified: Secondary | ICD-10-CM

## 2024-02-09 DIAGNOSIS — J189 Pneumonia, unspecified organism: Secondary | ICD-10-CM

## 2024-02-09 DIAGNOSIS — M545 Low back pain, unspecified: Secondary | ICD-10-CM | POA: Diagnosis not present

## 2024-02-09 LAB — COMPREHENSIVE METABOLIC PANEL WITH GFR
ALT: 15 U/L (ref 0–53)
AST: 28 U/L (ref 0–37)
Albumin: 3.6 g/dL (ref 3.5–5.2)
Alkaline Phosphatase: 131 U/L — ABNORMAL HIGH (ref 39–117)
BUN: 5 mg/dL — ABNORMAL LOW (ref 6–23)
CO2: 36 meq/L — ABNORMAL HIGH (ref 19–32)
Calcium: 8.9 mg/dL (ref 8.4–10.5)
Chloride: 88 meq/L — ABNORMAL LOW (ref 96–112)
Creatinine, Ser: 0.42 mg/dL (ref 0.40–1.50)
GFR: 105.21 mL/min (ref >60.00)
Glucose, Bld: 93 mg/dL (ref 70–99)
Potassium: 4.1 meq/L (ref 3.5–5.1)
Sodium: 130 meq/L — ABNORMAL LOW (ref 135–145)
Total Bilirubin: 0.7 mg/dL (ref 0.2–1.2)
Total Protein: 6.4 g/dL (ref 6.0–8.3)

## 2024-02-09 LAB — CBC WITH DIFFERENTIAL/PLATELET
Basophils Absolute: 0.1 10*3/uL (ref 0.0–0.1)
Basophils Relative: 1.4 % (ref 0.0–3.0)
Eosinophils Absolute: 0.1 10*3/uL (ref 0.0–0.7)
Eosinophils Relative: 2.8 % (ref 0.0–5.0)
HCT: 40.4 % (ref 39.0–52.0)
Hemoglobin: 14.1 g/dL (ref 13.0–17.0)
Lymphocytes Relative: 17.5 % (ref 12.0–46.0)
Lymphs Abs: 0.9 10*3/uL (ref 0.7–4.0)
MCHC: 34.8 g/dL (ref 30.0–36.0)
MCV: 101.1 fl — ABNORMAL HIGH (ref 78.0–100.0)
Monocytes Absolute: 0.5 10*3/uL (ref 0.1–1.0)
Monocytes Relative: 9.5 % (ref 3.0–12.0)
Neutro Abs: 3.4 10*3/uL (ref 1.4–7.7)
Neutrophils Relative %: 68.8 % (ref 43.0–77.0)
Platelets: 254 10*3/uL (ref 150.0–400.0)
RBC: 4 Mil/uL — ABNORMAL LOW (ref 4.22–5.81)
RDW: 12.4 % (ref 11.5–15.5)
WBC: 4.9 10*3/uL (ref 4.0–10.5)

## 2024-02-09 MED ORDER — AMBULATORY NON FORMULARY MEDICATION: 0 refills | Status: AC

## 2024-02-09 NOTE — Patient Instructions (Signed)
 Thank you for coming in today.   Please get an Xray today before you leave   Please get labs today before you leave   Check back in 1 month

## 2024-02-09 NOTE — Progress Notes (Unsigned)
 I, Dustin Park am a scribe for Dr. [name], MD.  Dustin Park is a 78 y.o. male who presents to [facility] at [facility] today for f/u LBP. Patient states that he is on pain pills now so it is not as bad as it has been. Insurance finally approved the nerve block. Pain pills are making him feel weird, no appetite, queezy. Some side effects from the medications that he is currently on.   He is using home oxygen.  He still smokes but only smokes outside and always turns the oxygen off and disconnected from his nose before he smokes.  Additionally he notes he would like to try to get a manual wheelchair.  He finds it to be much easier to get into doctors offices and around in a wheelchair than a walker.   Pertinent review of systems: No fevers or chills  Relevant historical information: Hypertension weight loss   Exam:  BP (!) 142/72   Pulse 70   Ht 5\' 11"  (1.803 m)   SpO2 95%   BMI 17.99 kg/m  General: Thin man seated in a wheelchair.  MSK: L-spine decreased lumbar motion.    Lab and Radiology Results  2 view chest x-ray obtained today personally and independently interpreted. Poor positioning due to the pain in seated in a wheelchair positioning. Developing atelectasis since the previous chest x-ray 1 week ago. Await formal radiology review  Part of the impression from CT scan abdomen pelvis January 24, 2024  Question 2 x 3 hypodense mass of the uncinate process poorly visualized in the setting of motion artifact. When the patient is clinically stable and able to follow directions and hold their breath (preferably as an outpatient) further evaluation with dedicated MRI pancreatic protocol should be considered.   Assessment and Plan: 78 y.o. male with chronic back pain.  Proceeding to an epidural steroid injection.  These were imaging should be contacting him in getting him scheduled.  I think this will help.  His overall health is poor.   He is losing weight and appears to be developing cachexia.  He does have a possible pancreas cyst or mass seen on the CT scan of his abdomen and pelvis on April 21.  I did talk to Alexader and his wife about this.  He really should have a MRI abdomen with dedicated pancreatic protocol.  However he is hurting so badly he does not think he will be able to lie down for that which I agree with.  Additionally when asked he would not very much be interested in proceeding to chemotherapy or surgery for a possible pancreatic cancer.  And in my opinion his overall functional status is so poor I am not sure he be a good candidate anyway.  Thankfully the CT scan is not highly concerning for cancer but still concerning enough and given his overall worsening state that is a possibility.  We did talk a little bit about pain control and goals of care.  He is not ready to consider hospice but he is going to think about it.  Will try to get a wheelchair.  Will check labs and x-ray again.  Encourage patient to follow-up with his PCP.  Recheck in about a month.   PDMP reviewed during this encounter. Orders Placed This Encounter  Procedures   DG Chest 2 View    Standing Status:   Future    Number of Occurrences:   1    Expected Date:  02/09/2024    Expiration Date:   02/08/2025    Reason for Exam (SYMPTOM  OR DIAGNOSIS REQUIRED):   previous pneumonia    Preferred imaging location?:   [place]   CBC with Differential/Platelet    Standing Status:   Future    Number of Occurrences:   1    Expiration Date:   02/08/2025   Comprehensive metabolic panel with GFR    Standing Status:   Future    Number of Occurrences:   1    Expiration Date:   02/08/2025   Meds ordered this encounter  Medications   AMBULATORY NON FORMULARY MEDICATION    Sig: Manual Wheelchair Dispense 1 Dx code: M46.061, J18.9, M48.56XA Use as needed    Dispense:  1 Device    Refill:  0     Discussed warning signs or symptoms. Please  see discharge instructions. Patient expresses understanding.   The above documentation has been reviewed and is accurate and complete [name], M.D.

## 2024-02-10 ENCOUNTER — Telehealth: Payer: Self-pay

## 2024-02-10 NOTE — Telephone Encounter (Signed)
 Per Dr. Jola Nash request, contacted the reading room this morning and requested a STAT read on pt's chest XR.

## 2024-02-10 NOTE — Progress Notes (Signed)
 Sodium is going down again.  Is not very low but it is worse than it was a week ago.  Please follow-up with your primary care provider. CBC looks okay.

## 2024-02-10 NOTE — Progress Notes (Signed)
 Chest x-ray shows atelectasis.  Pneumonia is a possibility but you do not have a fever or trouble breathing that is new so I do not think pneumonia is likely.  If you are anxious about this and would like me to prescribe you some more antibiotics I am happy to but I do not think it is necessary.

## 2024-02-17 NOTE — Discharge Instructions (Signed)

## 2024-02-21 ENCOUNTER — Ambulatory Visit
Admission: RE | Admit: 2024-02-21 | Discharge: 2024-02-21 | Disposition: A | Discharge status: Home / Self Care | Source: Ambulatory Visit | Attending: Family Medicine | Admitting: Family Medicine

## 2024-02-21 DIAGNOSIS — G8929 Other chronic pain: Secondary | ICD-10-CM

## 2024-02-21 DIAGNOSIS — M48061 Spinal stenosis, lumbar region without neurogenic claudication: Secondary | ICD-10-CM

## 2024-02-21 MED ORDER — IOPAMIDOL (ISOVUE-M 200) INJECTION 41%
1.0000 mL | Freq: Once | INTRAMUSCULAR | Status: AC
  Administered 2024-02-21: 1 mL via EPIDURAL

## 2024-02-21 MED ORDER — METHYLPREDNISOLONE ACETATE 40 MG/ML INJ SUSP (RADIOLOG
80.0000 mg | Freq: Once | INTRAMUSCULAR | Status: AC
  Administered 2024-02-21: 80 mg via EPIDURAL

## 2024-02-24 LAB — COLOGUARD

## 2024-03-08 NOTE — Progress Notes (Unsigned)
 I, [name], CMA acting as a scribe for [name], MD.  Dustin Park is a 78 y.o. male who presents to [facility] at [facility] today for f/u compression fx, pneumonia, hyponatremia and back pain. Pt was last seen by Dr. [name] on 02/09/24 and they discussed pain control, hospice care, and we ordered him a wheelchair.  Today, pt reports continued back pain. Had back injection, has helped some for mid-back pain. Having trouble standing up straight. Appetite has been better at times. Ambulating with a cane, using a wheel chair for longer distances. No falls since last visit.   At the last visit we talked about proceeding to MRI to evaluate the possible pancreatic mass seen on the CT scan abdomen pelvis.  After consideration he would like to have that evaluate the MRI.  His back is feeling a bit better and he thinks will be able to tolerate an MRI.  Pertinent review of systems: Continued weight loss.  No fevers or chills.  Relevant historical information: Takes duloxetine for depression and pain.   Exam:  BP 130/62   Pulse 78   Ht 5\' 11"  (1.803 m)   Wt 116 lb 12.8 oz (53 kg)   SpO2 100%   BMI 16.29 kg/m  General: Thin decreased muscle mass.  And in no acute distress.   MSK: L-spine nontender palpation midline decreased lumbar motion.      Assessment and Plan: 78 y.o. male with chronic back pain.  Patient has had some improved with an epidural steroid injection.  Part of the pain is due to spinal stenosis and part of the pain is due to the compression fracture seen in the spine.  His pain is still not well-controlled even with opiates.  Will try adding calcitonin nasal spray.  That may be helpful.  Pancreas mass seen on CT scan: Does not look to be cancer but certainly should have further evaluation.  He is willing to proceed to MRI and potential future treatment.  Will proceed to MR abdomen with pancreas protocol.  Weight loss: Unclear etiology.  He  does not have much of an appetite.  Neck step could be mirtazapine.  He has to taper off of the duloxetine first which he is not sure if he wants to do or not.  Let me know if he changes his mind.  Otherwise recheck in 1 month.   PDMP not reviewed this encounter. Orders Placed This Encounter  Procedures   MR ABDOMEN MRCP W WO CONTAST    W/ pancreatic protocol to evaluation pancreatic mass    Standing Status:   Future    Expiration Date:   03/09/2025    If indicated for the ordered procedure, I authorize the administration of contrast media per Radiology protocol:   Yes    What is the patient's sedation requirement?:   No Sedation    Does the patient have a pacemaker or implanted devices?:   No    Preferred imaging location?:   [facility] (table limit-550lbs)   Meds ordered this encounter  Medications   calcitonin, salmon, (MIACALCIN/FORTICAL) 200 UNIT/ACT nasal spray    Sig: Place 1 spray into alternate nostrils daily.    Dispense:  3.7 mL    Refill:  12     Discussed warning signs or symptoms. Please see discharge instructions. Patient expresses understanding.   Total encounter time 30 minutes including face-to-face time with the patient and, reviewing past medical record, and charting on  the date of service.

## 2024-03-09 ENCOUNTER — Encounter: Payer: Self-pay | Admitting: Family Medicine

## 2024-03-09 ENCOUNTER — Ambulatory Visit: Admitting: Family Medicine

## 2024-03-09 VITALS — BP 130/62 | HR 78 | Ht 71.0 in | Wt 116.8 lb

## 2024-03-09 DIAGNOSIS — K8689 Other specified diseases of pancreas: Secondary | ICD-10-CM

## 2024-03-09 DIAGNOSIS — M4856XG Collapsed vertebra, not elsewhere classified, lumbar region, subsequent encounter for fracture with delayed healing: Secondary | ICD-10-CM | POA: Diagnosis not present

## 2024-03-09 DIAGNOSIS — G8929 Other chronic pain: Secondary | ICD-10-CM

## 2024-03-09 DIAGNOSIS — E43 Unspecified severe protein-calorie malnutrition: Secondary | ICD-10-CM

## 2024-03-09 DIAGNOSIS — M545 Low back pain, unspecified: Secondary | ICD-10-CM | POA: Diagnosis not present

## 2024-03-09 MED ORDER — CALCITONIN (SALMON) 200 UNIT/ACT NA SOLN: 1.0000 | Freq: Every day | NASAL | 12 refills | Status: DC

## 2024-03-10 MED ORDER — MIRTAZAPINE 15 MG PO TABS: 15.0000 mg | ORAL_TABLET | Freq: Every day | ORAL | 1 refills | Status: DC

## 2024-03-10 MED ORDER — DULOXETINE HCL 20 MG PO CPEP: ORAL_CAPSULE | ORAL | 0 refills | Status: DC

## 2024-03-10 NOTE — Telephone Encounter (Signed)
 Forwarding to Dr. Denyse Amass to review and advise.

## 2024-04-02 ENCOUNTER — Other Ambulatory Visit: Payer: Self-pay | Admitting: Family Medicine

## 2024-04-03 NOTE — Telephone Encounter (Signed)
 Requesting 90 day supply.

## 2024-04-24 ENCOUNTER — Ambulatory Visit
Admission: RE | Admit: 2024-04-24 | Discharge: 2024-04-24 | Disposition: A | Discharge status: Home / Self Care | Source: Ambulatory Visit | Attending: Family Medicine | Admitting: Family Medicine

## 2024-04-24 DIAGNOSIS — K8689 Other specified diseases of pancreas: Secondary | ICD-10-CM

## 2024-04-24 MED ORDER — GADOPICLENOL 0.5 MMOL/ML IV SOLN
5.0000 mL | Freq: Once | INTRAVENOUS | Status: AC | PRN
  Administered 2024-04-24: 5 mL via INTRAVENOUS

## 2024-04-25 ENCOUNTER — Ambulatory Visit: Payer: Self-pay | Admitting: Family Medicine

## 2024-04-25 ENCOUNTER — Telehealth: Payer: Self-pay | Admitting: Gastroenterology

## 2024-04-25 DIAGNOSIS — K8689 Other specified diseases of pancreas: Secondary | ICD-10-CM

## 2024-04-25 NOTE — Telephone Encounter (Signed)
 Dr. Wilhelmenia,  Urgent referral for EUS Pancreas cystic mass seen on CT scan and MRI  Thanks [facility]

## 2024-04-25 NOTE — Progress Notes (Signed)
 The cystic lesion on the pancreas has grown a little bit.  The radiologists are not quite sure what exactly it is but would recommend a biopsy or fine-needle aspiration.  I am going to refer to gastroenterology to get that done.  You should hear soon about scheduling from them.

## 2024-04-26 NOTE — Telephone Encounter (Signed)
 Patient can be scheduled for upper EUS fine-needle aspiration. If he wants to discuss this further, he can be scheduled for a clinic visit with me (okay to overbook or use a held slot). Please forward the final decision by patient to the referring provider. Thanks. GM

## 2024-04-27 NOTE — Telephone Encounter (Signed)
 Left message on machine to call back

## 2024-04-27 NOTE — Telephone Encounter (Signed)
 I spoke with the pt wife and discussed EUS- she prefers to make appt to discuss with Dr Wilhelmenia in the office.  Appt made for 06/14/24 I will also add to the wait list

## 2024-05-03 DIAGNOSIS — E871 Hypo-osmolality and hyponatremia: Secondary | ICD-10-CM

## 2024-06-14 ENCOUNTER — Encounter: Payer: Self-pay | Admitting: Gastroenterology

## 2024-06-14 ENCOUNTER — Other Ambulatory Visit (INDEPENDENT_AMBULATORY_CARE_PROVIDER_SITE_OTHER)

## 2024-06-14 ENCOUNTER — Ambulatory Visit: Admitting: Gastroenterology

## 2024-06-14 VITALS — BP 138/70 | HR 83 | Ht 63.0 in | Wt 122.0 lb

## 2024-06-14 DIAGNOSIS — Z1211 Encounter for screening for malignant neoplasm of colon: Secondary | ICD-10-CM

## 2024-06-14 DIAGNOSIS — K862 Cyst of pancreas: Secondary | ICD-10-CM

## 2024-06-14 DIAGNOSIS — K219 Gastro-esophageal reflux disease without esophagitis: Secondary | ICD-10-CM

## 2024-06-14 DIAGNOSIS — J9611 Chronic respiratory failure with hypoxia: Secondary | ICD-10-CM

## 2024-06-14 DIAGNOSIS — J9620 Acute and chronic respiratory failure, unspecified whether with hypoxia or hypercapnia: Secondary | ICD-10-CM

## 2024-06-14 DIAGNOSIS — Z5320 Procedure and treatment not carried out because of patient's decision for unspecified reasons: Secondary | ICD-10-CM

## 2024-06-14 DIAGNOSIS — J9601 Acute respiratory failure with hypoxia: Secondary | ICD-10-CM

## 2024-06-14 DIAGNOSIS — F17210 Nicotine dependence, cigarettes, uncomplicated: Secondary | ICD-10-CM | POA: Diagnosis not present

## 2024-06-14 DIAGNOSIS — J690 Pneumonitis due to inhalation of food and vomit: Secondary | ICD-10-CM

## 2024-06-14 NOTE — Progress Notes (Unsigned)
 GASTROENTEROLOGY OUTPATIENT CLINIC VISIT   Primary Care Provider Reita Locus, MD [address] [place] [address] [phone]  Referring Provider Reita Locus, MD [address] [place],  [place] [address] [phone]  Patient Profile: Dustin Park is a 78 y.o. male with a pmh significant for hypertension, arthritis, COPD, chronic home O2 (since 2025 after pneumonia), continued tobacco use, GERD, pancreatic cyst, status postcholecystectomy.  The patient presents to the [facility] for an evaluation and management of problem(s) noted below:  Problem List 1. Pancreatic cyst   2. Colon cancer screening declined   3. Gastroesophageal reflux disease, unspecified whether esophagitis present   4. Acute on chronic respiratory failure, unspecified whether with hypoxia or hypercapnia (HCC)     Discussed the use of AI scribe software for clinical note transcription with the patient, who gave verbal consent to proceed.  History of Present Illness Dustin Park is a 78 year old male who presents for evaluation of a pancreatic cyst. He is accompanied by his wife, Benno Brensinger. He was referred for evaluation of a pancreatic cyst found incidentally during imaging for other issues that has been enlarging.  Incidental finding of the pancreatic cyst was noted during hospitalization for pneumonia.  He is still on O2 when prior to his hospitalization he had not been on any O2.  He is not sure if he wants to follow-up with a Pulmonologist.   He has a history of abdominal pain since age 84, which he attributes to an ulcer though has never seen a GI provider about this.  He has been taking omeprazole  for about a year, started by his PCP, which initially helped with his symptoms of acid reflux (was taking many TUMS a day), but its effectiveness has waned for the pain, but he still has no GERD symptoms.  He has hunger pains, which he has today.  He denies dysphagia or  odynophagia.  He attempted a Cologuard test, but sample was not able to be processed.  He has not undergone a colonoscopy and is not interested in having one at any time in the future.   GI Review of Systems Positive as above Negative for dysphagia, odynophagia, melena, hematochezia  Review of Systems General: Denies fevers/chills/weight loss unintentionally Cardiovascular: Denies chest pain Pulmonary: Denies significant change in his shortness of breath (he is not sure he was follow-up with pulmonary) Gastroenterological: See HPI Genitourinary: Denies darkened urine Hematological: Denies easy bruising/bleeding Dermatological: Denies jaundice Psychological: Mood is stable  Medications Current Outpatient Medications  Medication Sig Dispense Refill   AMBULATORY NON FORMULARY MEDICATION Manual Wheelchair Dispense 1 Dx code: M46.061, J18.9, M48.56XA Use as needed 1 Device 0   amLODipine -benazepril (LOTREL) 5-10 MG capsule Take 1 capsule by mouth daily.     carvedilol  (COREG ) 12.5 MG tablet Take 12.5 mg by mouth 2 (two) times daily with a meal.     fluticasone (FLONASE) 50 MCG/ACT nasal spray Place 2 sprays into both nostrils daily.     gabapentin  (NEURONTIN ) 300 MG capsule Take 1 capsule (300 mg total) by mouth 3 (three) times daily as needed. 270 capsule 3   HYDROcodone -acetaminophen  (NORCO) 7.5-325 MG tablet Take 1 tablet by mouth every 6 (six) hours as needed.     mirtazapine  (REMERON ) 15 MG tablet TAKE 1 TAB BY MOUTH AT BEDTIME. START 15 MG MIRTAZAPINE  AFTER FINISHING THE TAPER OFF OF DULOXETINE  90 tablet 1   omeprazole  (PRILOSEC) 40 MG capsule Take 40 mg by mouth daily.     OXYGEN 2  L continuous.     tiZANidine  (ZANAFLEX ) 4 MG capsule Take 4 mg by mouth 3 (three) times daily.     No current facility-administered medications for this visit.    Allergies Allergies  Allergen Reactions   Penicillins Swelling   Latex Itching and Dermatitis    Histories Past Medical History:   Diagnosis Date   Chronic pain    Degenerative arthritis    Hypertension    Hypertension    Osteoarthritis    Pancreatic cyst    Past Surgical History:  Procedure Laterality Date   CHOLECYSTECTOMY     TONSILLECTOMY     Social History   Socioeconomic History   Marital status: Married    Spouse name: Not on file   Number of children: Not on file   Years of education: Not on file   Highest education level: Not on file  Occupational History   Not on file  Tobacco Use   Smoking status: Every Day    Current packs/day: 1.00    Types: Cigarettes   Smokeless tobacco: Never  Vaping Use   Vaping status: Never Used  Substance and Sexual Activity   Alcohol use: Yes    Comment: 1 bottle of wine everyday 06-14-24   Drug use: No   Sexual activity: Not on file  Other Topics Concern   Not on file  Social History Narrative   Not on file   Social Drivers of Health   Financial Resource Strain: Low Risk  (05/17/2024)   Received from [facility]   Overall Financial Resource Strain (CARDIA)    How hard is it for you to pay for the very basics like food, housing, medical care, and heating?: Not very hard  Food Insecurity: No Food Insecurity (05/17/2024)   Received from [facility]   Hunger Vital Sign    Within the past 12 months, you worried that your food would run out before you got the money to buy more.: Never true    Within the past 12 months, the food you bought just didn't last and you didn't have money to get more.: Never true  Transportation Needs: No Transportation Needs (05/17/2024)   Received from [facility] - Transportation    In the past 12 months, has lack of transportation kept you from medical appointments or from getting medications?: No    In the past 12 months, has lack of transportation kept you from meetings, work, or from getting things needed for daily living?: No  Physical Activity: Insufficiently Active (05/17/2024)   Received from [facility]    Exercise Vital Sign    On average, how many days per week do you engage in moderate to strenuous exercise (like a brisk walk)?: 1 day    On average, how many minutes do you engage in exercise at this level?: 10 min  Stress: No Stress Concern Present (05/17/2024)   Received from [facility] - Occupational Stress Questionnaire    Do you feel stress - tense, restless, nervous, or anxious, or unable to sleep at night because your mind is troubled all the time - these days?: Only a little  Social Connections: Moderately Integrated (05/17/2024)   Received from [facility]   Social Network    How would you rate your social network (family, work, friends)?: Adequate participation with social networks  Intimate Partner Violence: Not At Risk (05/17/2024)   Received from [facility]   HITS  Over the last 12 months how often did your partner physically hurt you?: Never    Over the last 12 months how often did your partner insult you or talk down to you?: Never    Over the last 12 months how often did your partner threaten you with physical harm?: Never    Over the last 12 months how often did your partner scream or curse at you?: Never   Family History  Problem Relation Age of Onset   Heart Problems Father    Colon cancer Neg Hx    Esophageal cancer Neg Hx    Pancreatic cancer Neg Hx    Stomach cancer Neg Hx    Diabetes Neg Hx    Inflammatory bowel disease Neg Hx    Liver disease Neg Hx    Rectal cancer Neg Hx    I have reviewed his medical, social, and family history in detail and updated the electronic medical record as necessary.    PHYSICAL EXAMINATION  BP 138/70   Pulse 83   Ht 5' 3 (1.6 m)   Wt 122 lb (55.3 kg) Comment: pt reported, in wheelchair  BMI 21.61 kg/m  Wt Readings from Last 3 Encounters:  06/14/24 122 lb (55.3 kg)  03/09/24 116 lb 12.8 oz (53 kg)  02/01/24 129 lb (58.5 kg)  GEN: NAD, appears older than stated age,  appears chronically ill, wife with him today PSYCH: Cooperative, without pressured speech EYE: Conjunctivae pink, sclerae anicteric ENT: MMM CV: Nontachycardic RESP: Audible wheezing noted GI: NABS, soft, NT/ND, without rebound MSK/EXT: No significant lower extremity edema SKIN: No jaundice NEURO:  Alert & Oriented x 3, no focal deficits   REVIEW OF DATA  I reviewed the following data at the time of this encounter:  GI Procedures and Studies  Cologuard Not able to be perforemd in 2025 based on sample  Laboratory Studies  Reviewed those in [facility]  Imaging Studies  July 2025 MRCP IMPRESSION: Motion degraded examination. Within this context: Increased size of the cystic lesion in the uncinate process of the pancreas now measuring 2.8 x 1.3 cm, previously 1.9 x 1.0 cm. Not definitively characterized on this motion degraded examination there is no definitive suspicious postcontrast enhancement or pancreatic ductal dilation. Recommend more definitive assessment by EUS/FNA versus continued surveillance with follow up pre and post contrast MRI/MRCP or pancreatic protocol CT  4/25 CTAP IMPRESSION: 1. No pulmonary embolus with limited evaluation of the subsegmental level. 2. Right lower lobe peribronchovascular ground-glass and consolidative airspace opacities. 3. Likely acute T9 compression fracture with at least 40% vertebral body height loss. 4. No acute intrathoracic, intra-abdominal, intrapelvic traumatic injury. 5. Similar-appearing chronic T11-T12 compression fractures. 6. Please see separately dictated CT lumbar spine 01/24/2024. 7. Aortic Atherosclerosis (ICD10-I70.0) and Emphysema (ICD10-J43.9). 8. Question 2 x 3 hypodense mass of the uncinate process poorly visualized in the setting of motion artifact. When the patient is clinically stable and able to follow directions and hold their breath (preferably as an outpatient) further evaluation with dedicated MRI pancreatic  protocol should be considered.   ASSESSMENT/PLAN  Mr. Brenning is a 78 y.o. male.  The patient is seen today for evaluation and management of:  1. Pancreatic cyst   2. Colon cancer screening declined   3. Gastroesophageal reflux disease, unspecified whether esophagitis present   4. Acute on chronic respiratory failure, unspecified whether with hypoxia or hypercapnia (HCC)    The patient is hemodynamically stable at this time.  Pulmonary status still, would be  ideally optimized.    Pancreatic Cyst Imaging shows progressive enlargement of the cystic lesion within short interval of less than six months.  This size growth as well as being near 3 cm of size, would suggest role of EUS.  However, there are no signs of significant enhancement, solid components, or ductal dilation on imaging. His pulmonary status and the risks associated with anesthesia are considerations in the decision-making process.  At this point, he wants to hold on EUS unless repeat imaging is concerning or progressive.  He understands that an underlying malignancy process could be at play, but I think also from a Pulmonary standpoint this needs optimization unless imaging/laboratories show progressive concerns. - Order CA-19-9 blood test - Plan for interval imaging study MRCP @ 75-month interval to monitor cyst size and characteristics -- Plan for Ativan  prior to MRCP to help with claustrophobia and anxiety - Consider endoscopic ultrasound if CA-19-9 is elevated or if further cyst growth is observed on imaging - Discuss risks of endoscopic ultrasound, including pancreatitis (2% risk) and infection (1% risk) and perforation and bleeding and aspiration and medication effects.  Gastroesophageal reflux disease (GERD) Typically, would want patient to proceed based on age and progressive GERD symptoms for EGD.  He is not interested in this currently, but if he does end up needing EUS we will be able to assess.  For now, he understands  that we could be missing masses/lesions.  Will continue current PPI dosing.   - Continue omeprazole , advise taking 30 minutes before meals - Consider H. pylori Diatherix testing  Chronic hypoxemic respiratory failure Etiology likely a result of continued smoking and COPD.  Higher risk for complications with his underlying respiratory status not being optimized, but as noted above if significant concern, we may not have a choice but to move forward with a procedure if necessary.  Will place referral to Pulmonary for further evaluation of chronic hypoxemic respiratory failure. - Refer to pulmonologist for evaluation of lung function and management of chronic hypoxemic respiratory failure - Encourage smoking cessation to improve respiratory health - Discuss potential benefits of smoking cessation on lung health and overall well-being       Orders Placed This Encounter  Procedures   Cancer antigen 19-9   Ambulatory referral to Pulmonology    New Prescriptions   No medications on file   Modified Medications   No medications on file    Planned Follow Up No follow-ups on file.   Total Time in Face-to-Face and in Coordination of Care for patient including independent/personal interpretation/review of prior testing, medical history, examination, medication adjustment, communicating results with the patient directly, and documentation within the EHR is 45 minutes.   Aloha Finner, MD [facility] Office # [phone]

## 2024-06-14 NOTE — Patient Instructions (Signed)
 Your provider has requested that you go to the basement level for lab work before leaving today. Press B on the elevator. The lab is located at the first door on the left as you exit the elevator.   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 Prescription for Ativan  will be sent to your pharmacy prior to you having MRI done.   We are referring you to [facility].  They will contact you directly to schedule an appointment.  It may take a week or more before you hear from them.  Please feel free to contact us  if you have not heard from them within 2 weeks and we will follow up on the referral.   _______________________________________________________  If your blood pressure at your visit was 140/90 or greater, please contact your primary care physician to follow up on this.  _______________________________________________________  If you are age 17 or older, your body mass index should be between 23-30. Your Body mass index is 21.61 kg/m. If this is out of the aforementioned range listed, please consider follow up with your Primary Care Provider.  If you are age 63 or younger, your body mass index should be between 19-25. Your Body mass index is 21.61 kg/m. If this is out of the aformentioned range listed, please consider follow up with your Primary Care Provider.   ________________________________________________________  The [facility] providers would like to encourage you to use MYCHART to communicate with providers for non-urgent requests or questions.  Due to long hold times on the telephone, sending your provider a message by [facility] may be a faster and more efficient way to get a response.  Please allow 48 business hours for a response.  Please remember that this is for non-urgent requests.  _______________________________________________________  [facility] is using a team-based approach to care.  Your team is made up of your doctor and two to three APPS. Our APPS  (Nurse Practitioners and Physician Assistants) work with your physician to ensure care continuity for you. They are fully qualified to address your health concerns and develop a treatment plan. They communicate directly with your gastroenterologist to care for you. Seeing the Advanced Practice Practitioners on your physician's team can help you by facilitating care more promptly, often allowing for earlier appointments, access to diagnostic testing, procedures, and other specialty referrals.   Due to recent changes in healthcare laws, you may see the results of your imaging and laboratory studies on MyChart before your provider has had a chance to review them.  We understand that in some cases there may be results that are confusing or concerning to you. Not all laboratory results come back in the same time frame and the provider may be waiting for multiple results in order to interpret others.  Please give us  48 hours in order for your provider to thoroughly review all the results before contacting the office for clarification of your results.   Thank you for choosing me and [facility].  Dr. Wilhelmenia

## 2024-06-15 LAB — CANCER ANTIGEN 19-9: CA 19-9: <3 U/mL (ref <34)

## 2024-06-17 ENCOUNTER — Encounter: Payer: Self-pay | Admitting: Gastroenterology

## 2024-06-17 DIAGNOSIS — Z1211 Encounter for screening for malignant neoplasm of colon: Secondary | ICD-10-CM | POA: Insufficient documentation

## 2024-06-17 DIAGNOSIS — K219 Gastro-esophageal reflux disease without esophagitis: Secondary | ICD-10-CM | POA: Insufficient documentation

## 2024-06-17 DIAGNOSIS — K862 Cyst of pancreas: Secondary | ICD-10-CM | POA: Insufficient documentation

## 2024-06-20 ENCOUNTER — Ambulatory Visit: Payer: Self-pay | Admitting: Gastroenterology

## 2024-07-13 ENCOUNTER — Encounter: Payer: Self-pay | Admitting: Gastroenterology

## 2024-09-05 ENCOUNTER — Encounter: Payer: Self-pay | Admitting: Gastroenterology

## 2024-09-05 ENCOUNTER — Other Ambulatory Visit: Payer: Self-pay

## 2024-09-05 DIAGNOSIS — K862 Cyst of pancreas: Secondary | ICD-10-CM

## 2024-09-05 MED ORDER — LORAZEPAM 1 MG PO TABS: ORAL_TABLET | ORAL | 0 refills | Status: AC

## 2024-09-05 NOTE — Telephone Encounter (Signed)
 Ro are you working on this?Looks like it was discussed at last office visit.

## 2024-09-05 NOTE — Progress Notes (Signed)
 Order has been placed for MRI. Ativan  has been sent to pharmacy.

## 2024-09-19 NOTE — Progress Notes (Signed)
 New Patient Pulmonology Office Visit   Subjective:  Patient ID: Dustin Park, male    DOB: Jan 01, 1946  MRN: [id number]  Referred by: Mansouraty, Aloha Raddle., MD  CC: No chief complaint on file.   HPI Dustin Park is a 78 y.o. male with ***  Aspiration pnuemonia and AHRF   {PULM QUESTIONNAIRES (Optional):33196}  ROS  Allergies: Penicillins and Latex Current Medications[1] Past Medical History:  Diagnosis Date   Chronic pain    Degenerative arthritis    Hypertension    Hypertension    Osteoarthritis    Pancreatic cyst    Past Surgical History:  Procedure Laterality Date   CHOLECYSTECTOMY     TONSILLECTOMY     Family History  Problem Relation Age of Onset   Heart Problems Father    Colon cancer Neg Hx    Esophageal cancer Neg Hx    Pancreatic cancer Neg Hx    Stomach cancer Neg Hx    Diabetes Neg Hx    Inflammatory bowel disease Neg Hx    Liver disease Neg Hx    Rectal cancer Neg Hx    Social History   Socioeconomic History   Marital status: Married    Spouse name: Not on file   Number of children: Not on file   Years of education: Not on file   Highest education level: Not on file  Occupational History   Not on file  Tobacco Use   Smoking status: Every Day    Current packs/day: 1.00    Types: Cigarettes   Smokeless tobacco: Never  Vaping Use   Vaping status: Never Used  Substance and Sexual Activity   Alcohol use: Yes    Comment: 1 bottle of wine everyday 06-14-24   Drug use: No   Sexual activity: Not on file  Other Topics Concern   Not on file  Social History Narrative   Not on file   Social Drivers of Health   Tobacco Use: High Risk (06/17/2024)   Patient History    Smoking Tobacco Use: Every Day    Smokeless Tobacco Use: Never    Passive Exposure: Not on file  Financial Resource Strain: Low Risk (05/17/2024)   Received from [facility]   Overall Financial Resource Strain (CARDIA)    How hard is it for you to pay for the  very basics like food, housing, medical care, and heating?: Not very hard  Food Insecurity: No Food Insecurity (05/17/2024)   Received from [facility]   Epic    Within the past 12 months, you worried that your food would run out before you got the money to buy more.: Never true    Within the past 12 months, the food you bought just didn't last and you didn't have money to get more.: Never true  Transportation Needs: No Transportation Needs (05/17/2024)   [facility]    In the past 12 months, has lack of transportation kept you from medical appointments or from getting medications?: No    In the past 12 months, has lack of transportation kept you from meetings, work, or from getting things needed for daily living?: No  Physical Activity: Insufficiently Active (05/17/2024)   Received from [facility]   Exercise Vital Sign    On average, how many days per week do you engage in moderate to strenuous exercise (like a brisk walk)?: 1 day    On average, how many minutes do you engage  in exercise at this level?: 10 min  Stress: No Stress Concern Present (05/17/2024)   Received from [facility] - Occupational Stress Questionnaire    Do you feel stress - tense, restless, nervous, or anxious, or unable to sleep at night because your mind is troubled all the time - these days?: Only a little  Social Connections: Moderately Integrated (05/17/2024)   Received from [facility]   Social Network    How would you rate your social network (family, work, friends)?: Adequate participation with social networks  Intimate Partner Violence: Not At Risk (05/17/2024)   Received from [facility]   HITS    Over the last 12 months how often did your partner scream or curse at you?: Never    Over the last 12 months how often did your partner physically hurt you?: Never    Over the last 12 months how often did your partner insult you or talk down to  you?: Never    Over the last 12 months how often did your partner threaten you with physical harm?: Never  Depression (PHQ2-9): Not on file  Alcohol Screen: Not on file  Housing: Low Risk (05/17/2024)   Received from [facility]    In the last 12 months, was there a time when you were not able to pay the mortgage or rent on time?: No    In the past 12 months, how many times have you moved where you were living?: 0    At any time in the past 12 months, were you homeless or living in a shelter (including now)?: No  Utilities: Not At Risk (05/17/2024)   Received from [facility]    In the past 12 months has the electric, gas, oil, or water company threatened to shut off services in your home?: No  Health Literacy: Not on file       Objective:  There were no vitals taken for this visit. {Pulm Vitals (Optional):32837}  Physical Exam  Diagnostic Review:  {Labs (Optional):32838}  01/24/2024   1. No pulmonary embolus with limited evaluation of the subsegmental level. 2. Right lower lobe peribronchovascular ground-glass and consolidative airspace opacities. 3. Likely acute T9 compression fracture with at least 40% vertebral body height loss. 4. No acute intrathoracic, intra-abdominal, intrapelvic traumatic injury. 5. Similar-appearing chronic T11-T12 compression fractures.  Assessment & Plan:   Assessment & Plan    No follow-ups on file.    Marny Patch, MD Pulmonary and Critical Care Medicine [facility]     [1]  Current Outpatient Medications:    AMBULATORY NON FORMULARY MEDICATION, Manual Wheelchair Dispense 1 Dx code: M46.061, J18.9, M48.56XA Use as needed, Disp: 1 Device, Rfl: 0   amLODipine -benazepril (LOTREL) 5-10 MG capsule, Take 1 capsule by mouth daily., Disp: , Rfl:    carvedilol  (COREG ) 12.5 MG tablet, Take 12.5 mg by mouth 2 (two) times daily with a meal., Disp: , Rfl:    fluticasone (FLONASE) 50 MCG/ACT nasal spray,  Place 2 sprays into both nostrils daily., Disp: , Rfl:    gabapentin  (NEURONTIN ) 300 MG capsule, Take 1 capsule (300 mg total) by mouth 3 (three) times daily as needed., Disp: 270 capsule, Rfl: 3   HYDROcodone -acetaminophen  (NORCO) 7.5-325 MG tablet, Take 1 tablet by mouth every 6 (six) hours as needed., Disp: , Rfl:    LORazepam  (ATIVAN ) 1 MG tablet, Take 1 pill by mouth 1 hour prior  to MRI., Disp: 1 tablet, Rfl: 0   mirtazapine  (REMERON ) 15 MG tablet, TAKE 1 TAB BY MOUTH AT BEDTIME. START 15 MG MIRTAZAPINE  AFTER FINISHING THE TAPER OFF OF DULOXETINE , Disp: 90 tablet, Rfl: 1   omeprazole  (PRILOSEC) 40 MG capsule, Take 40 mg by mouth daily., Disp: , Rfl:    OXYGEN, 2 L continuous., Disp: , Rfl:    tiZANidine  (ZANAFLEX ) 4 MG capsule, Take 4 mg by mouth 3 (three) times daily., Disp: , Rfl:

## 2024-09-20 ENCOUNTER — Ambulatory Visit

## 2024-09-20 VITALS — BP 173/65 | HR 63 | Ht 68.5 in | Wt 127.8 lb

## 2024-09-20 DIAGNOSIS — J42 Unspecified chronic bronchitis: Secondary | ICD-10-CM

## 2024-09-20 DIAGNOSIS — J4489 Other specified chronic obstructive pulmonary disease: Secondary | ICD-10-CM

## 2024-09-20 DIAGNOSIS — J9610 Chronic respiratory failure, unspecified whether with hypoxia or hypercapnia: Secondary | ICD-10-CM | POA: Diagnosis not present

## 2024-09-20 DIAGNOSIS — J432 Centrilobular emphysema: Secondary | ICD-10-CM | POA: Diagnosis not present

## 2024-09-20 DIAGNOSIS — F17210 Nicotine dependence, cigarettes, uncomplicated: Secondary | ICD-10-CM

## 2024-09-20 DIAGNOSIS — F17200 Nicotine dependence, unspecified, uncomplicated: Secondary | ICD-10-CM

## 2024-09-20 MED ORDER — STIOLTO RESPIMAT 2.5-2.5 MCG/ACT IN AERS: 2.0000 | INHALATION_SPRAY | Freq: Every day | RESPIRATORY_TRACT | 6 refills | Status: AC

## 2024-09-20 NOTE — Patient Instructions (Addendum)
 Dear Dustin Park;  Given your history of smoking I will recommend the following: - Stiollto 2 puffs daily.  - Albuterol  a need for shortness of breath every 4-6 hours.  - I will send you a referral for lung cancer screening to rule out any concerning nodules.  - I will recommend to check your oxygen at home, if it is <88%, means that you need oxygen.  -I will recommend to decrease the number of cigarettes a day over time.  -Exercise is really important to you to mobilize your mucus in the chest.   INHALER INSTRUCTIONS: To use the inhaler you follow these steps: Prime the inhaler according to instructions (which means waste between 1-4 doses before next use).  Follow package insert Shake the inhaler before each use Exhale completely by blowing all the air out of your lungs Press down on the canister then inhale SLOW and STEADY until your fill your lungs completely. Hold the breath for 6-10 seconds Gently exhale Wait 60 seconds then repeat steps 2-8.  Your pharmacist can also review proper technique if you have any remaining questions. Let me know if the cost is too high, your insurance may be able to recommend a more affordable option for you.

## 2024-10-10 ENCOUNTER — Telehealth: Payer: Self-pay | Admitting: Acute Care

## 2024-10-10 DIAGNOSIS — Z122 Encounter for screening for malignant neoplasm of respiratory organs: Secondary | ICD-10-CM

## 2024-10-10 DIAGNOSIS — Z87891 Personal history of nicotine dependence: Secondary | ICD-10-CM

## 2024-10-10 DIAGNOSIS — F17210 Nicotine dependence, cigarettes, uncomplicated: Secondary | ICD-10-CM

## 2024-10-10 NOTE — Telephone Encounter (Signed)
 Lung Cancer Screening Narrative/Criteria Questionnaire (Cigarette Smokers Only- No Cigars/Pipes/vapes)   JAX KENTNER   SDMV:10/16/2024 11:00 [place]       June 09, 1946   LDCT: 10/19/2024 1:00pm GI    79 y.o.   Phone: [phone]  Lung Screening Narrative (confirm age 65-77 yrs Medicare / 50-80 yrs Private pay insurance)   Insurance information:[employer] mcr   Referring Provider:Dr. Winfred   This screening involves an initial phone call with a team member from our program. It is called a shared decision making visit. The initial meeting is required by  insurance and Medicare to make sure you understand the program. This appointment takes about 15-20 minutes to complete. You will complete the screening scan at your scheduled date/time.  This scan takes about 5-10 minutes to complete. You can eat and drink normally before and after the scan.  Criteria questions for Lung Cancer Screening:   Are you a current or former smoker? Current Age began smoking: 79yo   If you are a former smoker, what year did you quit smoking? N/A(within 15 yrs)   To calculate your smoking history, I need an accurate estimate of how many packs of cigarettes you smoked per day and for how many years. (Not just the number of PPD you are now smoking)   Years smoking 56 x Packs per day 1 = Pack years 56   (at least 20 pack yrs)   (Make sure they understand that we need to know how much they have smoked in the past, not just the number of PPD they are smoking now)  Do you have a personal history of cancer?  No    Do you have a family history of cancer? Yes  (cancer type and and relative) Brother - prostate  Are you coughing up blood?  No  Have you had unexplained weight loss of 15 lbs or more in the last 6 months? No  It looks like you meet all criteria.  When would be a good time for us  to schedule you for this screening?   Additional information: N/A

## 2024-10-16 ENCOUNTER — Encounter: Payer: Self-pay | Admitting: Adult Health

## 2024-10-16 ENCOUNTER — Ambulatory Visit: Admitting: Adult Health

## 2024-10-16 DIAGNOSIS — F17210 Nicotine dependence, cigarettes, uncomplicated: Secondary | ICD-10-CM

## 2024-10-16 NOTE — Patient Instructions (Signed)

## 2024-10-16 NOTE — Progress Notes (Signed)
" °  Virtual Visit via Telephone Note  I connected with Dustin Park , 10/16/2024 11:03 AM by a telemedicine application and verified that I am speaking with the correct person using two identifiers.  Location: Patient: home Provider: home   I discussed the limitations of evaluation and management by telemedicine and the availability of in person appointments. The patient expressed understanding and agreed to proceed.   Shared Decision Making Visit Lung Cancer Screening Program [phone])   Eligibility: 79 y.o. Pack Years Smoking History Calculation = 56 pack years  (# packs/per year x # years smoked) Recent History of coughing up blood  no Unexplained weight loss? no ( >Than 15 pounds within the last 6 months ) Prior History Lung / other cancer no (Diagnosis within the last 5 years already requiring surveillance chest CT Scans). Smoking Status Current Smoker  Visit Components: Discussion included one or more decision making aids. YES Discussion included risk/benefits of screening. YES Discussion included potential follow up diagnostic testing for abnormal scans. YES Discussion included meaning and risk of over diagnosis. YES Discussion included meaning and risk of False Positives. YES Discussion included meaning of total radiation exposure. YES  Counseling Included: Importance of adherence to annual lung cancer LDCT screening. YES Impact of comorbidities on ability to participate in the program. YES Ability and willingness to under diagnostic treatment. YES  Smoking Cessation Counseling: Current Smokers:  Discussed importance of smoking cessation. yes Information about tobacco cessation classes and interventions provided to patient. yes Patient provided with ticket for LDCT Scan. yes Symptomatic Patient. NO Diagnosis Code: Tobacco Use Z72.0 Asymptomatic Patient yes  Counseling - 4 minutes of smoking cessation counseling  Smoking/Tobacco Cessation Counseling Dustin Park is a current user of tobacco or nicotine products. He is not ready to quit at this time. Counseling provided today addressed the risks of continued use and the benefits of cessation. Discussed tobacco/nicotine use history, readiness to quit, and evidence-based treatment options including behavioral strategies, support resources, and pharmacologic therapies. Provided encouragement and educational materials on steps and resources to quit smoking. Patient questions were addressed, and follow-up recommended for continued support. Total time spent on counseling: 3 minutes.    Z12.2-Screening of respiratory organs Z87.891-Personal history of nicotine dependence   [name] 10/16/2024        "

## 2024-10-17 ENCOUNTER — Ambulatory Visit
Admission: RE | Admit: 2024-10-17 | Discharge: 2024-10-17 | Disposition: A | Source: Ambulatory Visit | Attending: Gastroenterology | Admitting: Gastroenterology

## 2024-10-17 DIAGNOSIS — K862 Cyst of pancreas: Secondary | ICD-10-CM

## 2024-10-17 MED ORDER — GADOPICLENOL 0.5 MMOL/ML IV SOLN
6.0000 mL | Freq: Once | INTRAVENOUS | Status: AC | PRN
  Administered 2024-10-17: 6 mL via INTRAVENOUS

## 2024-10-19 ENCOUNTER — Ambulatory Visit: Payer: Self-pay | Admitting: Gastroenterology

## 2024-10-19 ENCOUNTER — Ambulatory Visit
Admission: RE | Admit: 2024-10-19 | Discharge: 2024-10-19 | Disposition: A | Source: Ambulatory Visit | Attending: Acute Care | Admitting: Acute Care

## 2024-10-19 DIAGNOSIS — Z122 Encounter for screening for malignant neoplasm of respiratory organs: Secondary | ICD-10-CM

## 2024-10-19 DIAGNOSIS — F17210 Nicotine dependence, cigarettes, uncomplicated: Secondary | ICD-10-CM

## 2024-10-19 DIAGNOSIS — Z87891 Personal history of nicotine dependence: Secondary | ICD-10-CM

## 2024-10-30 ENCOUNTER — Telehealth: Payer: Self-pay

## 2024-10-30 NOTE — Telephone Encounter (Signed)
 Call Report from [facility]  IMPRESSION: 1. 11.5 mm anteromedial left lower lobe nodule is unchanged from 01/24/2024. Lung-RADS 3, probably benign findings. Short-term follow-up in 6 months is recommended with repeat low-dose chest CT without contrast (please use the following order, CT CHEST LCS NODULE FOLLOW-UP W/O CM). These results will be called to the ordering clinician or representative by the Radiologist Assistant, and communication documented in the PACS or [employer]. 2. Bilateral lower lobe endobronchial debris, peribronchial thickening, peribronchovascular nodularity and volume loss may be due to aspiration. 3. Tiny bilateral pleural effusions. 4. Cylindrical bronchiectasis. 5. T6 compression fracture is new in the interval but age indeterminate. 6. Aortic atherosclerosis (ICD10-I70.0). Coronary artery calcification. 7.  Emphysema (ICD10-J43.9).

## 2024-10-31 NOTE — Telephone Encounter (Signed)
 1st LCS scan, will hold for provider.

## 2024-11-02 ENCOUNTER — Encounter: Payer: Self-pay | Admitting: Gastroenterology

## 2024-11-02 ENCOUNTER — Other Ambulatory Visit: Payer: Self-pay

## 2024-11-02 DIAGNOSIS — K862 Cyst of pancreas: Secondary | ICD-10-CM

## 2024-11-03 ENCOUNTER — Telehealth: Payer: Self-pay | Admitting: Acute Care

## 2024-11-03 NOTE — Telephone Encounter (Signed)
 Per Lauraine Lites, NP - Please call pt and advise we will do a 6 month follow up scan to review nodule seen on this scan.  Attempted to contact pt but had to leave VM for pt to call back.

## 2024-11-03 NOTE — Telephone Encounter (Signed)
 Scan was read as a LR 3. 6 month follow up is good. This will be due in 04/2025. Please let him know. Please place 6 month order, and let PCP know results and plan of care. Thanks so much

## 2024-11-06 NOTE — Telephone Encounter (Signed)
 Please see encounter form 10/30/2024 for update, patient has been contacted.

## 2024-11-07 NOTE — Telephone Encounter (Signed)
 LVM on spouse's cell to have patient call office, letter mailed to home. Mychart message has been reviewed by patient on 11/06/2024 to call the office.

## 2024-11-08 ENCOUNTER — Other Ambulatory Visit: Payer: Self-pay

## 2024-11-08 DIAGNOSIS — R911 Solitary pulmonary nodule: Secondary | ICD-10-CM

## 2024-12-26 ENCOUNTER — Ambulatory Visit

## 2024-12-26 ENCOUNTER — Encounter
